# Patient Record
Sex: Female | Born: 1945 | Race: White | Hispanic: Refuse to answer | Marital: Married | Smoking: Never smoker
Health system: Southern US, Community
[De-identification: ages and names within clinical notes are randomized; demographics above are authoritative.]

## PROBLEM LIST (undated history)

## (undated) ENCOUNTER — Ambulatory Visit (HOSPITAL_BASED_OUTPATIENT_CLINIC_OR_DEPARTMENT_OTHER): Admission: EM | Source: Home / Self Care

## (undated) DIAGNOSIS — K5909 Other constipation: Secondary | ICD-10-CM

## (undated) DIAGNOSIS — R059 Cough, unspecified: Secondary | ICD-10-CM

## (undated) DIAGNOSIS — E785 Hyperlipidemia, unspecified: Secondary | ICD-10-CM

## (undated) DIAGNOSIS — J309 Allergic rhinitis, unspecified: Secondary | ICD-10-CM

## (undated) DIAGNOSIS — K219 Gastro-esophageal reflux disease without esophagitis: Secondary | ICD-10-CM

## (undated) HISTORY — DX: Cough, unspecified: R05.9

## (undated) HISTORY — DX: Allergic rhinitis, unspecified: J30.9

## (undated) HISTORY — PX: UPPER GASTROINTESTINAL ENDOSCOPY: SHX188

## (undated) HISTORY — DX: Other constipation: K59.09

## (undated) HISTORY — DX: Hyperlipidemia, unspecified: E78.5

## (undated) HISTORY — DX: Gastro-esophageal reflux disease without esophagitis: K21.9

---

## 1983-08-06 HISTORY — PX: PARTIAL HYSTERECTOMY: SHX80

## 2000-04-11 ENCOUNTER — Other Ambulatory Visit: Admission: RE | Admit: 2000-04-11 | Discharge: 2000-04-11 | Payer: Self-pay | Admitting: Obstetrics and Gynecology

## 2001-06-30 ENCOUNTER — Other Ambulatory Visit: Admission: RE | Admit: 2001-06-30 | Discharge: 2001-06-30 | Payer: Self-pay | Admitting: Obstetrics and Gynecology

## 2002-07-22 ENCOUNTER — Other Ambulatory Visit: Admission: RE | Admit: 2002-07-22 | Discharge: 2002-07-22 | Payer: Self-pay | Admitting: Obstetrics and Gynecology

## 2003-08-02 ENCOUNTER — Other Ambulatory Visit: Admission: RE | Admit: 2003-08-02 | Discharge: 2003-08-02 | Payer: Self-pay | Admitting: Obstetrics and Gynecology

## 2003-08-06 HISTORY — PX: COLONOSCOPY: SHX174

## 2004-11-01 ENCOUNTER — Other Ambulatory Visit: Admission: RE | Admit: 2004-11-01 | Discharge: 2004-11-01 | Payer: Self-pay | Admitting: Obstetrics and Gynecology

## 2012-08-05 HISTORY — PX: KNEE ARTHROSCOPY: SHX127

## 2014-09-01 DIAGNOSIS — Z6825 Body mass index (BMI) 25.0-25.9, adult: Secondary | ICD-10-CM | POA: Diagnosis not present

## 2014-09-01 DIAGNOSIS — J019 Acute sinusitis, unspecified: Secondary | ICD-10-CM | POA: Diagnosis not present

## 2014-09-01 DIAGNOSIS — J209 Acute bronchitis, unspecified: Secondary | ICD-10-CM | POA: Diagnosis not present

## 2014-09-14 DIAGNOSIS — Z6824 Body mass index (BMI) 24.0-24.9, adult: Secondary | ICD-10-CM | POA: Diagnosis not present

## 2014-09-14 DIAGNOSIS — J208 Acute bronchitis due to other specified organisms: Secondary | ICD-10-CM | POA: Diagnosis not present

## 2014-12-27 DIAGNOSIS — Z9181 History of falling: Secondary | ICD-10-CM | POA: Diagnosis not present

## 2014-12-27 DIAGNOSIS — E785 Hyperlipidemia, unspecified: Secondary | ICD-10-CM | POA: Diagnosis not present

## 2014-12-27 DIAGNOSIS — J449 Chronic obstructive pulmonary disease, unspecified: Secondary | ICD-10-CM | POA: Diagnosis not present

## 2014-12-27 DIAGNOSIS — R0789 Other chest pain: Secondary | ICD-10-CM | POA: Diagnosis not present

## 2014-12-27 DIAGNOSIS — H539 Unspecified visual disturbance: Secondary | ICD-10-CM | POA: Diagnosis not present

## 2014-12-27 DIAGNOSIS — Z0000 Encounter for general adult medical examination without abnormal findings: Secondary | ICD-10-CM | POA: Diagnosis not present

## 2014-12-27 DIAGNOSIS — R05 Cough: Secondary | ICD-10-CM | POA: Diagnosis not present

## 2014-12-27 DIAGNOSIS — Z23 Encounter for immunization: Secondary | ICD-10-CM | POA: Diagnosis not present

## 2014-12-27 DIAGNOSIS — Z1231 Encounter for screening mammogram for malignant neoplasm of breast: Secondary | ICD-10-CM | POA: Diagnosis not present

## 2015-01-16 DIAGNOSIS — H5210 Myopia, unspecified eye: Secondary | ICD-10-CM | POA: Diagnosis not present

## 2015-01-16 DIAGNOSIS — H524 Presbyopia: Secondary | ICD-10-CM | POA: Diagnosis not present

## 2015-01-25 DIAGNOSIS — Z0100 Encounter for examination of eyes and vision without abnormal findings: Secondary | ICD-10-CM | POA: Diagnosis not present

## 2015-02-01 DIAGNOSIS — Z1231 Encounter for screening mammogram for malignant neoplasm of breast: Secondary | ICD-10-CM | POA: Diagnosis not present

## 2015-05-09 DIAGNOSIS — Z6826 Body mass index (BMI) 26.0-26.9, adult: Secondary | ICD-10-CM | POA: Diagnosis not present

## 2015-05-09 DIAGNOSIS — R1032 Left lower quadrant pain: Secondary | ICD-10-CM | POA: Diagnosis not present

## 2015-06-09 DIAGNOSIS — R062 Wheezing: Secondary | ICD-10-CM | POA: Diagnosis not present

## 2015-06-09 DIAGNOSIS — J189 Pneumonia, unspecified organism: Secondary | ICD-10-CM | POA: Diagnosis not present

## 2015-07-03 DIAGNOSIS — R7309 Other abnormal glucose: Secondary | ICD-10-CM | POA: Diagnosis not present

## 2015-07-03 DIAGNOSIS — E785 Hyperlipidemia, unspecified: Secondary | ICD-10-CM | POA: Diagnosis not present

## 2015-07-11 DIAGNOSIS — R05 Cough: Secondary | ICD-10-CM | POA: Diagnosis not present

## 2015-07-11 DIAGNOSIS — R062 Wheezing: Secondary | ICD-10-CM | POA: Diagnosis not present

## 2015-08-17 DIAGNOSIS — K219 Gastro-esophageal reflux disease without esophagitis: Secondary | ICD-10-CM | POA: Diagnosis not present

## 2015-08-17 DIAGNOSIS — Z6826 Body mass index (BMI) 26.0-26.9, adult: Secondary | ICD-10-CM | POA: Diagnosis not present

## 2015-08-17 DIAGNOSIS — R05 Cough: Secondary | ICD-10-CM | POA: Diagnosis not present

## 2015-09-06 DIAGNOSIS — H60539 Acute contact otitis externa, unspecified ear: Secondary | ICD-10-CM | POA: Diagnosis not present

## 2015-09-06 DIAGNOSIS — J0100 Acute maxillary sinusitis, unspecified: Secondary | ICD-10-CM | POA: Diagnosis not present

## 2015-10-17 DIAGNOSIS — R05 Cough: Secondary | ICD-10-CM | POA: Diagnosis not present

## 2015-10-17 DIAGNOSIS — K219 Gastro-esophageal reflux disease without esophagitis: Secondary | ICD-10-CM | POA: Diagnosis not present

## 2015-11-01 DIAGNOSIS — K2900 Acute gastritis without bleeding: Secondary | ICD-10-CM | POA: Diagnosis not present

## 2015-11-01 DIAGNOSIS — R05 Cough: Secondary | ICD-10-CM | POA: Diagnosis not present

## 2015-11-01 DIAGNOSIS — K2970 Gastritis, unspecified, without bleeding: Secondary | ICD-10-CM | POA: Diagnosis not present

## 2015-11-01 DIAGNOSIS — M1990 Unspecified osteoarthritis, unspecified site: Secondary | ICD-10-CM | POA: Diagnosis not present

## 2015-11-01 DIAGNOSIS — E785 Hyperlipidemia, unspecified: Secondary | ICD-10-CM | POA: Diagnosis not present

## 2015-11-01 DIAGNOSIS — K449 Diaphragmatic hernia without obstruction or gangrene: Secondary | ICD-10-CM | POA: Diagnosis not present

## 2015-11-01 DIAGNOSIS — Z79899 Other long term (current) drug therapy: Secondary | ICD-10-CM | POA: Diagnosis not present

## 2015-11-01 DIAGNOSIS — K219 Gastro-esophageal reflux disease without esophagitis: Secondary | ICD-10-CM | POA: Diagnosis not present

## 2015-11-01 DIAGNOSIS — K209 Esophagitis, unspecified: Secondary | ICD-10-CM | POA: Diagnosis not present

## 2015-12-04 DIAGNOSIS — Z6825 Body mass index (BMI) 25.0-25.9, adult: Secondary | ICD-10-CM | POA: Diagnosis not present

## 2015-12-04 DIAGNOSIS — M79672 Pain in left foot: Secondary | ICD-10-CM | POA: Diagnosis not present

## 2015-12-12 DIAGNOSIS — R062 Wheezing: Secondary | ICD-10-CM | POA: Diagnosis not present

## 2015-12-12 DIAGNOSIS — J209 Acute bronchitis, unspecified: Secondary | ICD-10-CM | POA: Diagnosis not present

## 2015-12-20 ENCOUNTER — Ambulatory Visit: Payer: Self-pay | Admitting: Sports Medicine

## 2015-12-22 DIAGNOSIS — J209 Acute bronchitis, unspecified: Secondary | ICD-10-CM | POA: Diagnosis not present

## 2015-12-29 DIAGNOSIS — E785 Hyperlipidemia, unspecified: Secondary | ICD-10-CM | POA: Diagnosis not present

## 2015-12-29 DIAGNOSIS — Z0000 Encounter for general adult medical examination without abnormal findings: Secondary | ICD-10-CM | POA: Diagnosis not present

## 2015-12-29 DIAGNOSIS — R05 Cough: Secondary | ICD-10-CM | POA: Diagnosis not present

## 2015-12-29 DIAGNOSIS — M8589 Other specified disorders of bone density and structure, multiple sites: Secondary | ICD-10-CM | POA: Diagnosis not present

## 2015-12-29 DIAGNOSIS — N939 Abnormal uterine and vaginal bleeding, unspecified: Secondary | ICD-10-CM | POA: Diagnosis not present

## 2015-12-29 DIAGNOSIS — Z6825 Body mass index (BMI) 25.0-25.9, adult: Secondary | ICD-10-CM | POA: Diagnosis not present

## 2015-12-29 DIAGNOSIS — R7309 Other abnormal glucose: Secondary | ICD-10-CM | POA: Diagnosis not present

## 2015-12-29 DIAGNOSIS — E663 Overweight: Secondary | ICD-10-CM | POA: Diagnosis not present

## 2015-12-29 DIAGNOSIS — Z1231 Encounter for screening mammogram for malignant neoplasm of breast: Secondary | ICD-10-CM | POA: Diagnosis not present

## 2016-01-03 DIAGNOSIS — R05 Cough: Secondary | ICD-10-CM | POA: Diagnosis not present

## 2016-01-03 DIAGNOSIS — R918 Other nonspecific abnormal finding of lung field: Secondary | ICD-10-CM | POA: Diagnosis not present

## 2016-01-04 ENCOUNTER — Ambulatory Visit: Payer: Self-pay | Admitting: Sports Medicine

## 2016-01-29 DIAGNOSIS — R938 Abnormal findings on diagnostic imaging of other specified body structures: Secondary | ICD-10-CM | POA: Diagnosis not present

## 2016-01-29 DIAGNOSIS — E785 Hyperlipidemia, unspecified: Secondary | ICD-10-CM | POA: Diagnosis not present

## 2016-01-29 DIAGNOSIS — I2510 Atherosclerotic heart disease of native coronary artery without angina pectoris: Secondary | ICD-10-CM | POA: Diagnosis not present

## 2016-01-29 DIAGNOSIS — R0609 Other forms of dyspnea: Secondary | ICD-10-CM | POA: Diagnosis not present

## 2016-02-08 DIAGNOSIS — R0609 Other forms of dyspnea: Secondary | ICD-10-CM | POA: Diagnosis not present

## 2016-02-08 DIAGNOSIS — R938 Abnormal findings on diagnostic imaging of other specified body structures: Secondary | ICD-10-CM | POA: Diagnosis not present

## 2016-02-08 DIAGNOSIS — E785 Hyperlipidemia, unspecified: Secondary | ICD-10-CM | POA: Diagnosis not present

## 2016-02-08 DIAGNOSIS — I2510 Atherosclerotic heart disease of native coronary artery without angina pectoris: Secondary | ICD-10-CM | POA: Diagnosis not present

## 2016-02-15 ENCOUNTER — Encounter: Payer: Self-pay | Admitting: Pulmonary Disease

## 2016-02-16 ENCOUNTER — Institutional Professional Consult (permissible substitution): Payer: Self-pay | Admitting: Pulmonary Disease

## 2016-02-22 ENCOUNTER — Encounter: Payer: Self-pay | Admitting: Internal Medicine

## 2016-02-23 ENCOUNTER — Encounter: Payer: Self-pay | Admitting: Internal Medicine

## 2016-02-23 ENCOUNTER — Ambulatory Visit (INDEPENDENT_AMBULATORY_CARE_PROVIDER_SITE_OTHER): Payer: Commercial Managed Care - HMO | Admitting: Internal Medicine

## 2016-02-23 VITALS — BP 138/78 | HR 72 | Ht 67.0 in | Wt 164.0 lb

## 2016-02-23 DIAGNOSIS — R053 Chronic cough: Secondary | ICD-10-CM

## 2016-02-23 DIAGNOSIS — R05 Cough: Secondary | ICD-10-CM

## 2016-02-23 DIAGNOSIS — R918 Other nonspecific abnormal finding of lung field: Secondary | ICD-10-CM | POA: Diagnosis not present

## 2016-02-23 HISTORY — DX: Other nonspecific abnormal finding of lung field: R91.8

## 2016-02-23 HISTORY — DX: Chronic cough: R05.3

## 2016-02-23 LAB — NITRIC OXIDE: NITRIC OXIDE: 23

## 2016-02-23 MED ORDER — GABAPENTIN 300 MG PO CAPS: ORAL_CAPSULE | ORAL | Status: DC

## 2016-02-23 NOTE — Progress Notes (Signed)
   Subjective:    Patient ID: Gina Hansen, female    DOB: [birth year], 70 y.o.   MRN: [id number]  HPI    Review of Systems  Constitutional: Negative for fever and unexpected weight change.  HENT: Negative for congestion, dental problem, ear pain, nosebleeds, postnasal drip, rhinorrhea, sinus pressure, sneezing, sore throat and trouble swallowing.   Eyes: Negative for redness and itching.  Respiratory: Positive for cough, shortness of breath and wheezing. Negative for chest tightness.   Cardiovascular: Negative for palpitations and leg swelling.  Gastrointestinal: Negative for nausea and vomiting.  Genitourinary: Negative for dysuria.  Musculoskeletal: Negative for joint swelling.  Skin: Negative for rash.  Neurological: Negative for headaches.  Hematological: Does not bruise/bleed easily.  Psychiatric/Behavioral: Negative for dysphoric mood. The patient is not nervous/anxious.        Objective:   Physical Exam        Assessment & Plan:

## 2016-02-23 NOTE — Patient Instructions (Addendum)
ICD-9-CM ICD-10-CM   1. Chronic cough 786.2 R05   2. Pulmonary infiltrate present on computed tomography 793.19 R91.8     Cough is from possible sinus drainage, possible  acid reflux, and talking a lot No evidence of asthma at this poiint Unclear how much the infiltrate is playing a role in cough All of this is working together to cause cyclical cough/LPR cough or cough neuropathy  #Sinus drainage  - - continue anti-histamine - - continuet nasal steroid  - CT sinus wo contrast in 6 weeks - Depending on response can consider ENT consult  #Possible Acid Reflux  - stop fish oil  - take otc zegerid 20mg   1 capsule daily on empty stomach    - - At all times avoid colas, spices, cheeses, spirits, red meats, beer, chocolates, fried foods etc.,   - sleep with head end of bed elevated  - eat small frequent meals  - do not go to bed for 3 hours after last meal    #Cyclical cough/Irritable Larynx  - please choose 2-3 days and observe complete voice rest - no talking or whispering  - at all times there  there is urge to cough, drink water or swallow or sip on throat lozenge - see Mr Garald Balding speech therapist - Take gabapentin 300mg  once daily x 5 days, then 300mg  twice daily x 5 days, then 300mg  three times daily to continue. If this makes you too sleepy or drowsy call us and we will cut your medication dosing down  #pulmonary infiltrate   - do repeat HRCT chest wo contrast early sept 2017 along with sinus CT  #Followup - CT sinus and HRCT chest early sept 2017 at [facility] - I will see you in early sep 2017 after the CTs and as afollowup

## 2016-02-23 NOTE — Addendum Note (Signed)
Addended by: Inge Rise on: 02/23/2016 09:57 AM   Modules accepted: Orders

## 2016-02-23 NOTE — Addendum Note (Signed)
Addended by: Inge Rise on: 02/23/2016 09:54 AM   Modules accepted: Orders

## 2016-02-23 NOTE — Progress Notes (Signed)
Subjective:     Patient ID: Gina Hansen, female   DOB: [birth year], 70 y.o.   MRN: [id number]  PCP Nicoletta Dress, MD   HPI   PCP Nicoletta Dress, MD   IOV 02/23/2016  Chief Complaint  Patient presents with  . Advice Only    Refer for abnorm CT done at [facility]. C/o some SOB, wheezing a lot, prod cough (yellow).    70 year old female referred for chronic cough. History is given by her and review of the outside chart. Insidious onset of cough for the last several years. Worse in the last year. She believes that ever since she started taking care of grandkids and having to talk a lot cough is worse. The quality of the cough is mostly dry but occasionally she has yellow sputum and associated wheezing especially this past year. There is no nocturnal awakening associated with the cough. Cough is made worse by talking. Cough is relieved by throat lozenge and staying quite. There is an associated feeling of itchiness in and constant sensation of something like according in her throat. RSI cough score is 12.  Cough associated history  Sinus drainage: She has spring allergies. She is on chronic antihistamine for many years. The last week she is on nasal steroids she does not know if this is helping her. She has never seen ENT.Marland Kitchen  Acid reflux: She believes she has mild acid reflux. She occasionally takes ranitidine. Earlier in 2017 did have upper endoscopy by Dr. Lyndel Safe gastroenterologist at [facility]. This was apparently reassuring. She is on daily fish oil for many years  Pulmonary history: She never has had a formal diagnosis of pulmonary disease. She says that over the many years she's had recurrent episodes of wheezing and cough that she's had multiple chest x-rays which are all clear. And also due to chronic cough 01/03/2016 and because of a clear chest x-ray pulmonary CT chest was done. This shows possible bronchiectasis in the lingula associated with some pulmonary infiltrates.  She is surprised by this finding. She never had any fever or chills at any point in time. Exhaled nitric oxide today was normal. She has tried Symbicort in the past and multiple prednisone courses these have not help her cough.  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 Details of mold exposure at that are not elicited.      Dr Lorenza Cambridge Reflux Symptom Index (> 13-15 suggestive of LPR cough) 0 -> 5  =  none ->severe problem 02/23/2016   Hoarseness of problem with voice 1  Clearing  Of Throat 2  Excess throat mucus or feeling of post nasal drip 2  Difficulty swallowing food, liquid or tablets 0  Cough after eating or lying down 1  Breathing difficulties or choking episodes 1  Troublesome or annoying cough 2  Sensation of something sticking in throat or lump in throat 2  Heartburn, chest pain, indigestion, or stomach acid coming up 1  TOTAL 12      Imaging evaluation 01/03/2015 she had CT chest without contrast at [facility]. Personally visualized image. She does have what appears to be mild bronchiectasis along with patchy infiltrate surrounding that in the lingula in the anterior component right behind the anterior chest wall.  Exhaled nitric oxide today in our office 02/23/2016: 23 ppb and normal   has a past medical history of Dyslipidemia; GERD (gastroesophageal reflux disease); and Allergic rhinitis.  reports that she has never smoked. She does not have any smokeless tobacco history on file.  Past Surgical History  Procedure Laterality Date  . Colonoscopy  2005  . Partial hysterectomy Left 1985  . Knee arthroscopy Left 2014    Allergies  Allergen Reactions  . Levaquin [Levofloxacin In D5w]     Immunization History  Administered Date(s) Administered  . Influenza Split 05/06/2015  . Pneumococcal Conjugate-13 08/05/2013  . Zoster 07/05/2014    Family History  Problem Relation Age  of Onset  . Kidney disease Father   . Anemia Other   . Hypertension Mother   . Heart disease Father   . Asthma Father   . Hypercholesterolemia Other      Current outpatient prescriptions:  .  aspirin 81 MG tablet, Take 81 mg by mouth daily., Disp: , Rfl:  .  Calcium Carb-Cholecalciferol (CALCIUM 600 + D PO), Take 1 tablet by mouth 2 (two) times daily., Disp: , Rfl:  .  cetirizine (ZYRTEC) 10 MG tablet, Take 10 mg by mouth daily., Disp: , Rfl:  .  Coenzyme Q10 (COQ10) 200 MG CAPS, Take 1 capsule by mouth daily., Disp: , Rfl:  .  Omega-3 Fatty Acids (FISH OIL) 1200 MG CAPS, Take 1 capsule by mouth 2 (two) times daily., Disp: , Rfl:  .  ranitidine (ZANTAC) 150 MG tablet, Take 150 mg by mouth 2 (two) times daily., Disp: , Rfl:  .  rosuvastatin (CRESTOR) 5 MG tablet, Take 5 mg by mouth daily., Disp: , Rfl:      Review of Systems     Objective:   Physical Exam  Constitutional: She is oriented to person, place, and time. She appears well-developed and well-nourished. No distress.  HENT:  Head: Normocephalic and atraumatic.  Right Ear: External ear normal.  Left Ear: External ear normal.  Mouth/Throat: Oropharynx is clear and moist. No oropharyngeal exudate.  Mild postnasal drip Clears her throat periodically  Eyes: Conjunctivae and EOM are normal. Pupils are equal, round, and reactive to light. Right eye exhibits no discharge. Left eye exhibits no discharge. No scleral icterus.  Neck: Normal range of motion. Neck supple. No JVD present. No tracheal deviation present. No thyromegaly present.  Cardiovascular: Normal rate, regular rhythm, normal heart sounds and intact distal pulses.  Exam reveals no gallop and no friction rub.   No murmur heard. Pulmonary/Chest: Effort normal and breath sounds normal. No respiratory distress. She has no wheezes. She has no rales. She exhibits no tenderness.  Abdominal: Soft. Bowel sounds are normal. She exhibits no distension and no mass. There is no  tenderness. There is no rebound and no guarding.  Musculoskeletal: Normal range of motion. She exhibits no edema or tenderness.  Lymphadenopathy:    She has no cervical adenopathy.  Neurological: She is alert and oriented to person, place, and time. She has normal reflexes. No cranial nerve deficit. She exhibits normal muscle tone. Coordination normal.  Skin: Skin is warm and dry. No rash noted. She is not diaphoretic. No erythema. No pallor.  Psychiatric: She has a normal mood and affect. Her behavior is normal. Judgment and thought content normal.  Vitals reviewed.   Filed Vitals:   02/23/16 0911  BP: 138/78  Pulse: 72  Height: 5\' 7"  (1.702 m)  Weight: 164 lb (74.39 kg)  SpO2: 99%        Assessment:       ICD-9-CM ICD-10-CM   1. Chronic cough 786.2 R05   2. Pulmonary infiltrate present  on computed tomography 793.19 R91.8        Plan:      Cough is from possible sinus drainage, possible  acid reflux, and talking a lot No evidence of asthma at this poiint Unclear how much the infiltrate is playing a role in cough All of this is working together to cause cyclical cough/LPR cough or cough neuropathy  #Sinus drainage  - - continue anti-histamine - - continuet nasal steroid  - CT sinus wo contrast in 6 weeks - ENT consult depending on follow-up progress  #Possible Acid Reflux - Stop fish oil  - take otc zegerid 20mg   1 capsule daily on empty stomach    - - At all times avoid colas, spices, cheeses, spirits, red meats, beer, chocolates, fried foods etc.,   - sleep with head end of bed elevated  - eat small frequent meals  - do not go to bed for 3 hours after last meal    #Cyclical cough/Irritable Larynx  - please choose 2-3 days and observe complete voice rest - no talking or whispering  - at all times there  there is urge to cough, drink water or swallow or sip on throat lozenge - see Mr Garald Balding speech therapist - Take gabapentin 300mg  once daily x 5 days,  then 300mg  twice daily x 5 days, then 300mg  three times daily to continue. If this makes you too sleepy or drowsy call us and we will cut your medication dosing down  #pulmonary infiltrate   - do repeat HRCT chest wo contrast early sept 2017 along with sinus CT  #Followup - CT sinus and HRCT chest early sept 2017 at [facility] - I will see you in early sep 2017 after the CTs and as afollowup  Dr. Brand Males, M.D., [facility].C.P Pulmonary and Critical Care Medicine Staff Physician [place] [facility] Critical [facility] Pager: [phone], If no answer or between  15:00h - 7:00h: call [phone]  02/23/2016 9:46 AM

## 2016-03-06 DIAGNOSIS — Z1231 Encounter for screening mammogram for malignant neoplasm of breast: Secondary | ICD-10-CM | POA: Diagnosis not present

## 2016-03-06 DIAGNOSIS — R0609 Other forms of dyspnea: Secondary | ICD-10-CM | POA: Diagnosis not present

## 2016-03-06 DIAGNOSIS — E785 Hyperlipidemia, unspecified: Secondary | ICD-10-CM | POA: Diagnosis not present

## 2016-03-06 DIAGNOSIS — I2510 Atherosclerotic heart disease of native coronary artery without angina pectoris: Secondary | ICD-10-CM | POA: Diagnosis not present

## 2016-03-11 DIAGNOSIS — R112 Nausea with vomiting, unspecified: Secondary | ICD-10-CM | POA: Diagnosis not present

## 2016-03-11 DIAGNOSIS — R1013 Epigastric pain: Secondary | ICD-10-CM | POA: Diagnosis not present

## 2016-03-11 DIAGNOSIS — K219 Gastro-esophageal reflux disease without esophagitis: Secondary | ICD-10-CM | POA: Diagnosis not present

## 2016-03-27 DIAGNOSIS — R0609 Other forms of dyspnea: Secondary | ICD-10-CM | POA: Diagnosis not present

## 2016-03-27 DIAGNOSIS — E785 Hyperlipidemia, unspecified: Secondary | ICD-10-CM | POA: Diagnosis not present

## 2016-03-27 DIAGNOSIS — I2510 Atherosclerotic heart disease of native coronary artery without angina pectoris: Secondary | ICD-10-CM | POA: Diagnosis not present

## 2016-04-10 DIAGNOSIS — R918 Other nonspecific abnormal finding of lung field: Secondary | ICD-10-CM | POA: Diagnosis not present

## 2016-04-10 DIAGNOSIS — I700 Atherosclerosis of aorta: Secondary | ICD-10-CM | POA: Diagnosis not present

## 2016-04-10 DIAGNOSIS — R05 Cough: Secondary | ICD-10-CM | POA: Diagnosis not present

## 2016-04-16 ENCOUNTER — Ambulatory Visit: Payer: Commercial Managed Care - HMO | Admitting: Internal Medicine

## 2016-04-22 ENCOUNTER — Telehealth: Payer: Self-pay | Admitting: Internal Medicine

## 2016-04-22 DIAGNOSIS — J320 Chronic maxillary sinusitis: Secondary | ICD-10-CM

## 2016-04-22 NOTE — Telephone Encounter (Signed)
CT 04/10/16 at [place] of sinuses - shows Chronic left maxillary sinus disease. Please refer her to ENT. Give fu in 1 month for chronic cough  Dr. Brand Males, M.D., [facility].C.P Pulmonary and Critical Care Medicine Staff Physician [place] Pulmonary and Critical Care Pager: [phone], If no answer or between  15:00h - 7:00h: call [phone]  04/22/2016 3:45 PM

## 2016-04-23 NOTE — Telephone Encounter (Signed)
Patient returning call - she can be reached on [phone]

## 2016-04-23 NOTE — Telephone Encounter (Signed)
Spoke with Gina Hansen. She is aware of results. Gina Hansen would like to referred to Dr. Melina Modena in [place]. Referral has been placed.

## 2016-04-23 NOTE — Telephone Encounter (Signed)
lmtcb for pt.  

## 2016-05-09 DIAGNOSIS — R05 Cough: Secondary | ICD-10-CM | POA: Diagnosis not present

## 2016-05-09 DIAGNOSIS — K219 Gastro-esophageal reflux disease without esophagitis: Secondary | ICD-10-CM | POA: Diagnosis not present

## 2016-05-09 DIAGNOSIS — Z9109 Other allergy status, other than to drugs and biological substances: Secondary | ICD-10-CM | POA: Diagnosis not present

## 2016-05-23 DIAGNOSIS — M25512 Pain in left shoulder: Secondary | ICD-10-CM | POA: Diagnosis not present

## 2016-05-23 DIAGNOSIS — S4992XA Unspecified injury of left shoulder and upper arm, initial encounter: Secondary | ICD-10-CM | POA: Diagnosis not present

## 2016-05-23 DIAGNOSIS — S8001XA Contusion of right knee, initial encounter: Secondary | ICD-10-CM | POA: Diagnosis not present

## 2016-05-23 DIAGNOSIS — S52132A Displaced fracture of neck of left radius, initial encounter for closed fracture: Secondary | ICD-10-CM | POA: Diagnosis not present

## 2016-05-23 DIAGNOSIS — S6992XA Unspecified injury of left wrist, hand and finger(s), initial encounter: Secondary | ICD-10-CM | POA: Diagnosis not present

## 2016-05-23 DIAGNOSIS — M25532 Pain in left wrist: Secondary | ICD-10-CM | POA: Diagnosis not present

## 2016-05-23 DIAGNOSIS — M7989 Other specified soft tissue disorders: Secondary | ICD-10-CM | POA: Diagnosis not present

## 2016-05-23 DIAGNOSIS — Z23 Encounter for immunization: Secondary | ICD-10-CM | POA: Diagnosis not present

## 2016-05-23 DIAGNOSIS — S52002A Unspecified fracture of upper end of left ulna, initial encounter for closed fracture: Secondary | ICD-10-CM | POA: Diagnosis not present

## 2016-05-23 DIAGNOSIS — S53105A Unspecified dislocation of left ulnohumeral joint, initial encounter: Secondary | ICD-10-CM | POA: Diagnosis not present

## 2016-05-23 DIAGNOSIS — S52102A Unspecified fracture of upper end of left radius, initial encounter for closed fracture: Secondary | ICD-10-CM | POA: Diagnosis not present

## 2016-05-23 DIAGNOSIS — S52042A Displaced fracture of coronoid process of left ulna, initial encounter for closed fracture: Secondary | ICD-10-CM | POA: Diagnosis not present

## 2016-05-24 DIAGNOSIS — S42402A Unspecified fracture of lower end of left humerus, initial encounter for closed fracture: Secondary | ICD-10-CM | POA: Diagnosis not present

## 2016-05-24 DIAGNOSIS — S52272A Monteggia's fracture of left ulna, initial encounter for closed fracture: Secondary | ICD-10-CM | POA: Diagnosis not present

## 2016-05-24 DIAGNOSIS — M7989 Other specified soft tissue disorders: Secondary | ICD-10-CM | POA: Diagnosis not present

## 2016-05-24 DIAGNOSIS — S52002A Unspecified fracture of upper end of left ulna, initial encounter for closed fracture: Secondary | ICD-10-CM | POA: Diagnosis not present

## 2016-05-28 DIAGNOSIS — S52272A Monteggia's fracture of left ulna, initial encounter for closed fracture: Secondary | ICD-10-CM | POA: Diagnosis not present

## 2016-05-28 DIAGNOSIS — S52092A Other fracture of upper end of left ulna, initial encounter for closed fracture: Secondary | ICD-10-CM | POA: Diagnosis not present

## 2016-05-28 DIAGNOSIS — S42402A Unspecified fracture of lower end of left humerus, initial encounter for closed fracture: Secondary | ICD-10-CM | POA: Diagnosis not present

## 2016-05-28 DIAGNOSIS — S52122A Displaced fracture of head of left radius, initial encounter for closed fracture: Secondary | ICD-10-CM | POA: Diagnosis not present

## 2016-06-03 DIAGNOSIS — S52032A Displaced fracture of olecranon process with intraarticular extension of left ulna, initial encounter for closed fracture: Secondary | ICD-10-CM | POA: Diagnosis not present

## 2016-06-03 DIAGNOSIS — S53105A Unspecified dislocation of left ulnohumeral joint, initial encounter: Secondary | ICD-10-CM | POA: Diagnosis not present

## 2016-06-03 DIAGNOSIS — S52132A Displaced fracture of neck of left radius, initial encounter for closed fracture: Secondary | ICD-10-CM | POA: Diagnosis not present

## 2016-06-03 DIAGNOSIS — J45909 Unspecified asthma, uncomplicated: Secondary | ICD-10-CM | POA: Diagnosis not present

## 2016-06-03 DIAGNOSIS — Z881 Allergy status to other antibiotic agents status: Secondary | ICD-10-CM | POA: Diagnosis not present

## 2016-06-03 DIAGNOSIS — K219 Gastro-esophageal reflux disease without esophagitis: Secondary | ICD-10-CM | POA: Diagnosis not present

## 2016-06-03 DIAGNOSIS — R05 Cough: Secondary | ICD-10-CM | POA: Diagnosis not present

## 2016-06-03 DIAGNOSIS — S52022A Displaced fracture of olecranon process without intraarticular extension of left ulna, initial encounter for closed fracture: Secondary | ICD-10-CM | POA: Diagnosis not present

## 2016-06-03 DIAGNOSIS — S52272A Monteggia's fracture of left ulna, initial encounter for closed fracture: Secondary | ICD-10-CM | POA: Diagnosis not present

## 2016-06-03 DIAGNOSIS — E785 Hyperlipidemia, unspecified: Secondary | ICD-10-CM | POA: Diagnosis not present

## 2016-06-03 DIAGNOSIS — S52042A Displaced fracture of coronoid process of left ulna, initial encounter for closed fracture: Secondary | ICD-10-CM | POA: Diagnosis not present

## 2016-06-03 DIAGNOSIS — S52022D Displaced fracture of olecranon process without intraarticular extension of left ulna, subsequent encounter for closed fracture with routine healing: Secondary | ICD-10-CM | POA: Diagnosis not present

## 2016-06-03 DIAGNOSIS — S53005A Unspecified dislocation of left radial head, initial encounter: Secondary | ICD-10-CM | POA: Diagnosis not present

## 2016-06-19 DIAGNOSIS — S52272D Monteggia's fracture of left ulna, subsequent encounter for closed fracture with routine healing: Secondary | ICD-10-CM | POA: Diagnosis not present

## 2016-06-19 DIAGNOSIS — Z881 Allergy status to other antibiotic agents status: Secondary | ICD-10-CM | POA: Diagnosis not present

## 2016-06-19 DIAGNOSIS — S42402D Unspecified fracture of lower end of left humerus, subsequent encounter for fracture with routine healing: Secondary | ICD-10-CM | POA: Diagnosis not present

## 2016-06-19 DIAGNOSIS — M25422 Effusion, left elbow: Secondary | ICD-10-CM | POA: Diagnosis not present

## 2016-07-02 DIAGNOSIS — S52272D Monteggia's fracture of left ulna, subsequent encounter for closed fracture with routine healing: Secondary | ICD-10-CM | POA: Diagnosis not present

## 2016-07-02 DIAGNOSIS — S52122D Displaced fracture of head of left radius, subsequent encounter for closed fracture with routine healing: Secondary | ICD-10-CM | POA: Diagnosis not present

## 2016-07-02 DIAGNOSIS — S52002D Unspecified fracture of upper end of left ulna, subsequent encounter for closed fracture with routine healing: Secondary | ICD-10-CM | POA: Diagnosis not present

## 2016-07-02 DIAGNOSIS — Z471 Aftercare following joint replacement surgery: Secondary | ICD-10-CM | POA: Diagnosis not present

## 2016-07-02 DIAGNOSIS — S42402D Unspecified fracture of lower end of left humerus, subsequent encounter for fracture with routine healing: Secondary | ICD-10-CM | POA: Diagnosis not present

## 2016-07-02 DIAGNOSIS — Z96622 Presence of left artificial elbow joint: Secondary | ICD-10-CM | POA: Diagnosis not present

## 2016-07-08 DIAGNOSIS — Z23 Encounter for immunization: Secondary | ICD-10-CM | POA: Diagnosis not present

## 2016-07-08 DIAGNOSIS — Z1389 Encounter for screening for other disorder: Secondary | ICD-10-CM | POA: Diagnosis not present

## 2016-07-08 DIAGNOSIS — K219 Gastro-esophageal reflux disease without esophagitis: Secondary | ICD-10-CM | POA: Diagnosis not present

## 2016-07-08 DIAGNOSIS — R7309 Other abnormal glucose: Secondary | ICD-10-CM | POA: Diagnosis not present

## 2016-07-08 DIAGNOSIS — E785 Hyperlipidemia, unspecified: Secondary | ICD-10-CM | POA: Diagnosis not present

## 2016-07-08 DIAGNOSIS — Z9181 History of falling: Secondary | ICD-10-CM | POA: Diagnosis not present

## 2016-07-08 DIAGNOSIS — M8589 Other specified disorders of bone density and structure, multiple sites: Secondary | ICD-10-CM | POA: Diagnosis not present

## 2016-07-08 DIAGNOSIS — E663 Overweight: Secondary | ICD-10-CM | POA: Diagnosis not present

## 2016-07-12 DIAGNOSIS — S52272D Monteggia's fracture of left ulna, subsequent encounter for closed fracture with routine healing: Secondary | ICD-10-CM | POA: Diagnosis not present

## 2016-07-12 DIAGNOSIS — M25622 Stiffness of left elbow, not elsewhere classified: Secondary | ICD-10-CM | POA: Diagnosis not present

## 2016-07-12 DIAGNOSIS — M25632 Stiffness of left wrist, not elsewhere classified: Secondary | ICD-10-CM | POA: Diagnosis not present

## 2016-07-12 DIAGNOSIS — M6281 Muscle weakness (generalized): Secondary | ICD-10-CM | POA: Diagnosis not present

## 2016-07-15 DIAGNOSIS — S52272D Monteggia's fracture of left ulna, subsequent encounter for closed fracture with routine healing: Secondary | ICD-10-CM | POA: Diagnosis not present

## 2016-07-15 DIAGNOSIS — M25622 Stiffness of left elbow, not elsewhere classified: Secondary | ICD-10-CM | POA: Diagnosis not present

## 2016-07-15 DIAGNOSIS — M6281 Muscle weakness (generalized): Secondary | ICD-10-CM | POA: Diagnosis not present

## 2016-07-15 DIAGNOSIS — M25632 Stiffness of left wrist, not elsewhere classified: Secondary | ICD-10-CM | POA: Diagnosis not present

## 2016-07-19 DIAGNOSIS — S52272D Monteggia's fracture of left ulna, subsequent encounter for closed fracture with routine healing: Secondary | ICD-10-CM | POA: Diagnosis not present

## 2016-07-19 DIAGNOSIS — M25632 Stiffness of left wrist, not elsewhere classified: Secondary | ICD-10-CM | POA: Diagnosis not present

## 2016-07-19 DIAGNOSIS — M25622 Stiffness of left elbow, not elsewhere classified: Secondary | ICD-10-CM | POA: Diagnosis not present

## 2016-07-19 DIAGNOSIS — M6281 Muscle weakness (generalized): Secondary | ICD-10-CM | POA: Diagnosis not present

## 2016-07-22 DIAGNOSIS — M25632 Stiffness of left wrist, not elsewhere classified: Secondary | ICD-10-CM | POA: Diagnosis not present

## 2016-07-22 DIAGNOSIS — M25622 Stiffness of left elbow, not elsewhere classified: Secondary | ICD-10-CM | POA: Diagnosis not present

## 2016-07-22 DIAGNOSIS — S52272D Monteggia's fracture of left ulna, subsequent encounter for closed fracture with routine healing: Secondary | ICD-10-CM | POA: Diagnosis not present

## 2016-07-22 DIAGNOSIS — M6281 Muscle weakness (generalized): Secondary | ICD-10-CM | POA: Diagnosis not present

## 2016-07-24 DIAGNOSIS — M6281 Muscle weakness (generalized): Secondary | ICD-10-CM | POA: Diagnosis not present

## 2016-07-24 DIAGNOSIS — M25632 Stiffness of left wrist, not elsewhere classified: Secondary | ICD-10-CM | POA: Diagnosis not present

## 2016-07-24 DIAGNOSIS — S52272D Monteggia's fracture of left ulna, subsequent encounter for closed fracture with routine healing: Secondary | ICD-10-CM | POA: Diagnosis not present

## 2016-07-24 DIAGNOSIS — M25622 Stiffness of left elbow, not elsewhere classified: Secondary | ICD-10-CM | POA: Diagnosis not present

## 2016-07-31 DIAGNOSIS — M25632 Stiffness of left wrist, not elsewhere classified: Secondary | ICD-10-CM | POA: Diagnosis not present

## 2016-07-31 DIAGNOSIS — M25622 Stiffness of left elbow, not elsewhere classified: Secondary | ICD-10-CM | POA: Diagnosis not present

## 2016-07-31 DIAGNOSIS — S52272D Monteggia's fracture of left ulna, subsequent encounter for closed fracture with routine healing: Secondary | ICD-10-CM | POA: Diagnosis not present

## 2016-07-31 DIAGNOSIS — M6281 Muscle weakness (generalized): Secondary | ICD-10-CM | POA: Diagnosis not present

## 2016-08-06 DIAGNOSIS — S52272D Monteggia's fracture of left ulna, subsequent encounter for closed fracture with routine healing: Secondary | ICD-10-CM | POA: Diagnosis not present

## 2016-08-06 DIAGNOSIS — M6281 Muscle weakness (generalized): Secondary | ICD-10-CM | POA: Diagnosis not present

## 2016-08-06 DIAGNOSIS — M25632 Stiffness of left wrist, not elsewhere classified: Secondary | ICD-10-CM | POA: Diagnosis not present

## 2016-08-06 DIAGNOSIS — M25622 Stiffness of left elbow, not elsewhere classified: Secondary | ICD-10-CM | POA: Diagnosis not present

## 2016-08-12 DIAGNOSIS — M8589 Other specified disorders of bone density and structure, multiple sites: Secondary | ICD-10-CM | POA: Diagnosis not present

## 2016-08-12 DIAGNOSIS — M8588 Other specified disorders of bone density and structure, other site: Secondary | ICD-10-CM | POA: Diagnosis not present

## 2016-08-13 DIAGNOSIS — M25622 Stiffness of left elbow, not elsewhere classified: Secondary | ICD-10-CM | POA: Diagnosis not present

## 2016-08-13 DIAGNOSIS — M25632 Stiffness of left wrist, not elsewhere classified: Secondary | ICD-10-CM | POA: Diagnosis not present

## 2016-08-13 DIAGNOSIS — S52272D Monteggia's fracture of left ulna, subsequent encounter for closed fracture with routine healing: Secondary | ICD-10-CM | POA: Diagnosis not present

## 2016-08-13 DIAGNOSIS — M6281 Muscle weakness (generalized): Secondary | ICD-10-CM | POA: Diagnosis not present

## 2016-08-15 DIAGNOSIS — M25622 Stiffness of left elbow, not elsewhere classified: Secondary | ICD-10-CM | POA: Diagnosis not present

## 2016-08-15 DIAGNOSIS — M25632 Stiffness of left wrist, not elsewhere classified: Secondary | ICD-10-CM | POA: Diagnosis not present

## 2016-08-15 DIAGNOSIS — S52272D Monteggia's fracture of left ulna, subsequent encounter for closed fracture with routine healing: Secondary | ICD-10-CM | POA: Diagnosis not present

## 2016-08-15 DIAGNOSIS — M6281 Muscle weakness (generalized): Secondary | ICD-10-CM | POA: Diagnosis not present

## 2016-08-20 DIAGNOSIS — M25632 Stiffness of left wrist, not elsewhere classified: Secondary | ICD-10-CM | POA: Diagnosis not present

## 2016-08-20 DIAGNOSIS — S52272D Monteggia's fracture of left ulna, subsequent encounter for closed fracture with routine healing: Secondary | ICD-10-CM | POA: Diagnosis not present

## 2016-08-20 DIAGNOSIS — M25622 Stiffness of left elbow, not elsewhere classified: Secondary | ICD-10-CM | POA: Diagnosis not present

## 2016-08-20 DIAGNOSIS — M6281 Muscle weakness (generalized): Secondary | ICD-10-CM | POA: Diagnosis not present

## 2016-08-26 DIAGNOSIS — S52272D Monteggia's fracture of left ulna, subsequent encounter for closed fracture with routine healing: Secondary | ICD-10-CM | POA: Diagnosis not present

## 2016-08-26 DIAGNOSIS — M25622 Stiffness of left elbow, not elsewhere classified: Secondary | ICD-10-CM | POA: Diagnosis not present

## 2016-08-26 DIAGNOSIS — M25632 Stiffness of left wrist, not elsewhere classified: Secondary | ICD-10-CM | POA: Diagnosis not present

## 2016-08-26 DIAGNOSIS — M6281 Muscle weakness (generalized): Secondary | ICD-10-CM | POA: Diagnosis not present

## 2016-08-27 DIAGNOSIS — S52022D Displaced fracture of olecranon process without intraarticular extension of left ulna, subsequent encounter for closed fracture with routine healing: Secondary | ICD-10-CM | POA: Diagnosis not present

## 2016-08-27 DIAGNOSIS — S52272D Monteggia's fracture of left ulna, subsequent encounter for closed fracture with routine healing: Secondary | ICD-10-CM | POA: Diagnosis not present

## 2016-08-27 DIAGNOSIS — S42402D Unspecified fracture of lower end of left humerus, subsequent encounter for fracture with routine healing: Secondary | ICD-10-CM | POA: Diagnosis not present

## 2016-08-27 DIAGNOSIS — Z96622 Presence of left artificial elbow joint: Secondary | ICD-10-CM | POA: Diagnosis not present

## 2016-09-05 DIAGNOSIS — J019 Acute sinusitis, unspecified: Secondary | ICD-10-CM | POA: Diagnosis not present

## 2016-09-05 DIAGNOSIS — M8589 Other specified disorders of bone density and structure, multiple sites: Secondary | ICD-10-CM | POA: Diagnosis not present

## 2016-09-05 DIAGNOSIS — Z6825 Body mass index (BMI) 25.0-25.9, adult: Secondary | ICD-10-CM | POA: Diagnosis not present

## 2016-09-12 DIAGNOSIS — J209 Acute bronchitis, unspecified: Secondary | ICD-10-CM | POA: Diagnosis not present

## 2016-09-12 DIAGNOSIS — J0100 Acute maxillary sinusitis, unspecified: Secondary | ICD-10-CM | POA: Diagnosis not present

## 2016-10-04 DIAGNOSIS — B029 Zoster without complications: Secondary | ICD-10-CM | POA: Diagnosis not present

## 2016-11-18 DIAGNOSIS — J329 Chronic sinusitis, unspecified: Secondary | ICD-10-CM | POA: Diagnosis not present

## 2016-12-15 DIAGNOSIS — R05 Cough: Secondary | ICD-10-CM | POA: Diagnosis not present

## 2016-12-15 DIAGNOSIS — M546 Pain in thoracic spine: Secondary | ICD-10-CM | POA: Diagnosis not present

## 2016-12-16 DIAGNOSIS — R791 Abnormal coagulation profile: Secondary | ICD-10-CM | POA: Diagnosis not present

## 2016-12-16 DIAGNOSIS — E785 Hyperlipidemia, unspecified: Secondary | ICD-10-CM | POA: Diagnosis not present

## 2016-12-16 DIAGNOSIS — R0981 Nasal congestion: Secondary | ICD-10-CM | POA: Diagnosis not present

## 2016-12-16 DIAGNOSIS — Z90710 Acquired absence of both cervix and uterus: Secondary | ICD-10-CM | POA: Diagnosis not present

## 2016-12-16 DIAGNOSIS — R05 Cough: Secondary | ICD-10-CM | POA: Diagnosis not present

## 2016-12-16 DIAGNOSIS — R062 Wheezing: Secondary | ICD-10-CM | POA: Diagnosis not present

## 2016-12-16 DIAGNOSIS — Z7982 Long term (current) use of aspirin: Secondary | ICD-10-CM | POA: Diagnosis not present

## 2016-12-16 DIAGNOSIS — R071 Chest pain on breathing: Secondary | ICD-10-CM | POA: Diagnosis not present

## 2017-01-07 DIAGNOSIS — Z139 Encounter for screening, unspecified: Secondary | ICD-10-CM | POA: Diagnosis not present

## 2017-01-07 DIAGNOSIS — E785 Hyperlipidemia, unspecified: Secondary | ICD-10-CM | POA: Diagnosis not present

## 2017-01-07 DIAGNOSIS — K219 Gastro-esophageal reflux disease without esophagitis: Secondary | ICD-10-CM | POA: Diagnosis not present

## 2017-01-07 DIAGNOSIS — R7309 Other abnormal glucose: Secondary | ICD-10-CM | POA: Diagnosis not present

## 2017-01-20 DIAGNOSIS — Z9181 History of falling: Secondary | ICD-10-CM | POA: Diagnosis not present

## 2017-01-20 DIAGNOSIS — Z139 Encounter for screening, unspecified: Secondary | ICD-10-CM | POA: Diagnosis not present

## 2017-01-20 DIAGNOSIS — Z136 Encounter for screening for cardiovascular disorders: Secondary | ICD-10-CM | POA: Diagnosis not present

## 2017-01-20 DIAGNOSIS — Z1389 Encounter for screening for other disorder: Secondary | ICD-10-CM | POA: Diagnosis not present

## 2017-01-20 DIAGNOSIS — Z23 Encounter for immunization: Secondary | ICD-10-CM | POA: Diagnosis not present

## 2017-01-20 DIAGNOSIS — Z0000 Encounter for general adult medical examination without abnormal findings: Secondary | ICD-10-CM | POA: Diagnosis not present

## 2017-01-20 DIAGNOSIS — E785 Hyperlipidemia, unspecified: Secondary | ICD-10-CM | POA: Diagnosis not present

## 2017-01-20 DIAGNOSIS — Z1231 Encounter for screening mammogram for malignant neoplasm of breast: Secondary | ICD-10-CM | POA: Diagnosis not present

## 2017-01-20 DIAGNOSIS — Z1211 Encounter for screening for malignant neoplasm of colon: Secondary | ICD-10-CM | POA: Diagnosis not present

## 2017-01-22 DIAGNOSIS — N3091 Cystitis, unspecified with hematuria: Secondary | ICD-10-CM | POA: Diagnosis not present

## 2017-01-22 DIAGNOSIS — R300 Dysuria: Secondary | ICD-10-CM | POA: Diagnosis not present

## 2017-02-14 DIAGNOSIS — Z1211 Encounter for screening for malignant neoplasm of colon: Secondary | ICD-10-CM | POA: Diagnosis not present

## 2017-02-14 DIAGNOSIS — K581 Irritable bowel syndrome with constipation: Secondary | ICD-10-CM | POA: Diagnosis not present

## 2017-02-17 DIAGNOSIS — D225 Melanocytic nevi of trunk: Secondary | ICD-10-CM | POA: Diagnosis not present

## 2017-02-17 DIAGNOSIS — L820 Inflamed seborrheic keratosis: Secondary | ICD-10-CM | POA: Diagnosis not present

## 2017-02-17 DIAGNOSIS — D2239 Melanocytic nevi of other parts of face: Secondary | ICD-10-CM | POA: Diagnosis not present

## 2017-02-17 DIAGNOSIS — L814 Other melanin hyperpigmentation: Secondary | ICD-10-CM | POA: Diagnosis not present

## 2017-02-17 DIAGNOSIS — L821 Other seborrheic keratosis: Secondary | ICD-10-CM | POA: Diagnosis not present

## 2017-02-17 DIAGNOSIS — D485 Neoplasm of uncertain behavior of skin: Secondary | ICD-10-CM | POA: Diagnosis not present

## 2017-02-25 DIAGNOSIS — H16223 Keratoconjunctivitis sicca, not specified as Sjogren's, bilateral: Secondary | ICD-10-CM | POA: Diagnosis not present

## 2017-02-25 DIAGNOSIS — H43813 Vitreous degeneration, bilateral: Secondary | ICD-10-CM | POA: Diagnosis not present

## 2017-02-25 DIAGNOSIS — H02834 Dermatochalasis of left upper eyelid: Secondary | ICD-10-CM | POA: Diagnosis not present

## 2017-02-25 DIAGNOSIS — H524 Presbyopia: Secondary | ICD-10-CM | POA: Diagnosis not present

## 2017-02-25 DIAGNOSIS — H02831 Dermatochalasis of right upper eyelid: Secondary | ICD-10-CM | POA: Diagnosis not present

## 2017-02-25 DIAGNOSIS — H2513 Age-related nuclear cataract, bilateral: Secondary | ICD-10-CM | POA: Diagnosis not present

## 2017-03-11 DIAGNOSIS — Z1231 Encounter for screening mammogram for malignant neoplasm of breast: Secondary | ICD-10-CM | POA: Diagnosis not present

## 2017-03-20 DIAGNOSIS — Z0100 Encounter for examination of eyes and vision without abnormal findings: Secondary | ICD-10-CM | POA: Diagnosis not present

## 2017-04-10 DIAGNOSIS — K219 Gastro-esophageal reflux disease without esophagitis: Secondary | ICD-10-CM | POA: Diagnosis not present

## 2017-04-10 DIAGNOSIS — K648 Other hemorrhoids: Secondary | ICD-10-CM | POA: Diagnosis not present

## 2017-04-10 DIAGNOSIS — K635 Polyp of colon: Secondary | ICD-10-CM | POA: Diagnosis not present

## 2017-04-10 DIAGNOSIS — K6389 Other specified diseases of intestine: Secondary | ICD-10-CM | POA: Diagnosis not present

## 2017-04-10 DIAGNOSIS — Z79899 Other long term (current) drug therapy: Secondary | ICD-10-CM | POA: Diagnosis not present

## 2017-04-10 DIAGNOSIS — Z1211 Encounter for screening for malignant neoplasm of colon: Secondary | ICD-10-CM | POA: Diagnosis not present

## 2017-04-10 DIAGNOSIS — K5730 Diverticulosis of large intestine without perforation or abscess without bleeding: Secondary | ICD-10-CM | POA: Diagnosis not present

## 2017-04-10 DIAGNOSIS — E785 Hyperlipidemia, unspecified: Secondary | ICD-10-CM | POA: Diagnosis not present

## 2017-04-18 DIAGNOSIS — Z9181 History of falling: Secondary | ICD-10-CM | POA: Diagnosis not present

## 2017-04-18 DIAGNOSIS — Z6826 Body mass index (BMI) 26.0-26.9, adult: Secondary | ICD-10-CM | POA: Diagnosis not present

## 2017-04-18 DIAGNOSIS — K219 Gastro-esophageal reflux disease without esophagitis: Secondary | ICD-10-CM | POA: Diagnosis not present

## 2017-04-18 DIAGNOSIS — Z0000 Encounter for general adult medical examination without abnormal findings: Secondary | ICD-10-CM | POA: Diagnosis not present

## 2017-04-18 DIAGNOSIS — M8580 Other specified disorders of bone density and structure, unspecified site: Secondary | ICD-10-CM | POA: Diagnosis not present

## 2017-04-18 DIAGNOSIS — Z1389 Encounter for screening for other disorder: Secondary | ICD-10-CM | POA: Diagnosis not present

## 2017-04-18 DIAGNOSIS — Z139 Encounter for screening, unspecified: Secondary | ICD-10-CM | POA: Diagnosis not present

## 2017-04-18 DIAGNOSIS — E785 Hyperlipidemia, unspecified: Secondary | ICD-10-CM | POA: Diagnosis not present

## 2017-04-25 DIAGNOSIS — M7062 Trochanteric bursitis, left hip: Secondary | ICD-10-CM | POA: Diagnosis not present

## 2017-04-29 DIAGNOSIS — J019 Acute sinusitis, unspecified: Secondary | ICD-10-CM | POA: Diagnosis not present

## 2017-04-29 DIAGNOSIS — J208 Acute bronchitis due to other specified organisms: Secondary | ICD-10-CM | POA: Diagnosis not present

## 2017-06-06 DIAGNOSIS — M7062 Trochanteric bursitis, left hip: Secondary | ICD-10-CM | POA: Diagnosis not present

## 2017-06-10 DIAGNOSIS — Z23 Encounter for immunization: Secondary | ICD-10-CM | POA: Diagnosis not present

## 2017-08-22 DIAGNOSIS — M7062 Trochanteric bursitis, left hip: Secondary | ICD-10-CM | POA: Diagnosis not present

## 2017-08-28 DIAGNOSIS — M25552 Pain in left hip: Secondary | ICD-10-CM | POA: Diagnosis not present

## 2017-08-28 DIAGNOSIS — M7062 Trochanteric bursitis, left hip: Secondary | ICD-10-CM | POA: Diagnosis not present

## 2017-08-28 DIAGNOSIS — R2689 Other abnormalities of gait and mobility: Secondary | ICD-10-CM | POA: Diagnosis not present

## 2017-08-28 DIAGNOSIS — M6281 Muscle weakness (generalized): Secondary | ICD-10-CM | POA: Diagnosis not present

## 2017-09-01 DIAGNOSIS — M7062 Trochanteric bursitis, left hip: Secondary | ICD-10-CM | POA: Diagnosis not present

## 2017-09-01 DIAGNOSIS — M6281 Muscle weakness (generalized): Secondary | ICD-10-CM | POA: Diagnosis not present

## 2017-09-01 DIAGNOSIS — M25552 Pain in left hip: Secondary | ICD-10-CM | POA: Diagnosis not present

## 2017-09-01 DIAGNOSIS — R2689 Other abnormalities of gait and mobility: Secondary | ICD-10-CM | POA: Diagnosis not present

## 2017-09-04 DIAGNOSIS — M25552 Pain in left hip: Secondary | ICD-10-CM | POA: Diagnosis not present

## 2017-09-04 DIAGNOSIS — M6281 Muscle weakness (generalized): Secondary | ICD-10-CM | POA: Diagnosis not present

## 2017-09-04 DIAGNOSIS — M7062 Trochanteric bursitis, left hip: Secondary | ICD-10-CM | POA: Diagnosis not present

## 2017-09-04 DIAGNOSIS — R2689 Other abnormalities of gait and mobility: Secondary | ICD-10-CM | POA: Diagnosis not present

## 2017-09-08 DIAGNOSIS — R2689 Other abnormalities of gait and mobility: Secondary | ICD-10-CM | POA: Diagnosis not present

## 2017-09-08 DIAGNOSIS — M6281 Muscle weakness (generalized): Secondary | ICD-10-CM | POA: Diagnosis not present

## 2017-09-08 DIAGNOSIS — M25552 Pain in left hip: Secondary | ICD-10-CM | POA: Diagnosis not present

## 2017-09-08 DIAGNOSIS — M7062 Trochanteric bursitis, left hip: Secondary | ICD-10-CM | POA: Diagnosis not present

## 2017-09-15 DIAGNOSIS — R2689 Other abnormalities of gait and mobility: Secondary | ICD-10-CM | POA: Diagnosis not present

## 2017-09-15 DIAGNOSIS — M6281 Muscle weakness (generalized): Secondary | ICD-10-CM | POA: Diagnosis not present

## 2017-09-15 DIAGNOSIS — M25552 Pain in left hip: Secondary | ICD-10-CM | POA: Diagnosis not present

## 2017-09-15 DIAGNOSIS — M7062 Trochanteric bursitis, left hip: Secondary | ICD-10-CM | POA: Diagnosis not present

## 2017-09-22 DIAGNOSIS — M7062 Trochanteric bursitis, left hip: Secondary | ICD-10-CM | POA: Diagnosis not present

## 2017-09-22 DIAGNOSIS — M25552 Pain in left hip: Secondary | ICD-10-CM | POA: Diagnosis not present

## 2017-09-22 DIAGNOSIS — M6281 Muscle weakness (generalized): Secondary | ICD-10-CM | POA: Diagnosis not present

## 2017-09-22 DIAGNOSIS — R2689 Other abnormalities of gait and mobility: Secondary | ICD-10-CM | POA: Diagnosis not present

## 2017-09-25 DIAGNOSIS — R2689 Other abnormalities of gait and mobility: Secondary | ICD-10-CM | POA: Diagnosis not present

## 2017-09-25 DIAGNOSIS — M7062 Trochanteric bursitis, left hip: Secondary | ICD-10-CM | POA: Diagnosis not present

## 2017-09-25 DIAGNOSIS — M6281 Muscle weakness (generalized): Secondary | ICD-10-CM | POA: Diagnosis not present

## 2017-09-25 DIAGNOSIS — M25552 Pain in left hip: Secondary | ICD-10-CM | POA: Diagnosis not present

## 2017-09-29 DIAGNOSIS — M6281 Muscle weakness (generalized): Secondary | ICD-10-CM | POA: Diagnosis not present

## 2017-09-29 DIAGNOSIS — R2689 Other abnormalities of gait and mobility: Secondary | ICD-10-CM | POA: Diagnosis not present

## 2017-09-29 DIAGNOSIS — M25552 Pain in left hip: Secondary | ICD-10-CM | POA: Diagnosis not present

## 2017-09-29 DIAGNOSIS — M7062 Trochanteric bursitis, left hip: Secondary | ICD-10-CM | POA: Diagnosis not present

## 2017-09-30 DIAGNOSIS — J0100 Acute maxillary sinusitis, unspecified: Secondary | ICD-10-CM | POA: Diagnosis not present

## 2017-09-30 DIAGNOSIS — H66009 Acute suppurative otitis media without spontaneous rupture of ear drum, unspecified ear: Secondary | ICD-10-CM | POA: Diagnosis not present

## 2017-10-03 DIAGNOSIS — M7062 Trochanteric bursitis, left hip: Secondary | ICD-10-CM | POA: Diagnosis not present

## 2017-10-16 DIAGNOSIS — K219 Gastro-esophageal reflux disease without esophagitis: Secondary | ICD-10-CM | POA: Diagnosis not present

## 2017-10-16 DIAGNOSIS — E785 Hyperlipidemia, unspecified: Secondary | ICD-10-CM | POA: Diagnosis not present

## 2017-10-16 DIAGNOSIS — Z6827 Body mass index (BMI) 27.0-27.9, adult: Secondary | ICD-10-CM | POA: Diagnosis not present

## 2017-10-16 DIAGNOSIS — Z139 Encounter for screening, unspecified: Secondary | ICD-10-CM | POA: Diagnosis not present

## 2017-10-16 DIAGNOSIS — M8580 Other specified disorders of bone density and structure, unspecified site: Secondary | ICD-10-CM | POA: Diagnosis not present

## 2017-10-16 DIAGNOSIS — Z79899 Other long term (current) drug therapy: Secondary | ICD-10-CM | POA: Diagnosis not present

## 2017-10-16 DIAGNOSIS — R7309 Other abnormal glucose: Secondary | ICD-10-CM | POA: Diagnosis not present

## 2017-10-16 DIAGNOSIS — M545 Low back pain: Secondary | ICD-10-CM | POA: Diagnosis not present

## 2017-11-25 DIAGNOSIS — R112 Nausea with vomiting, unspecified: Secondary | ICD-10-CM | POA: Diagnosis not present

## 2017-11-25 DIAGNOSIS — R1011 Right upper quadrant pain: Secondary | ICD-10-CM | POA: Diagnosis not present

## 2017-12-01 DIAGNOSIS — R109 Unspecified abdominal pain: Secondary | ICD-10-CM | POA: Diagnosis not present

## 2017-12-01 DIAGNOSIS — R10816 Epigastric abdominal tenderness: Secondary | ICD-10-CM | POA: Diagnosis not present

## 2017-12-23 DIAGNOSIS — I8312 Varicose veins of left lower extremity with inflammation: Secondary | ICD-10-CM | POA: Diagnosis not present

## 2017-12-23 DIAGNOSIS — I8311 Varicose veins of right lower extremity with inflammation: Secondary | ICD-10-CM | POA: Diagnosis not present

## 2018-01-22 DIAGNOSIS — Z1331 Encounter for screening for depression: Secondary | ICD-10-CM | POA: Diagnosis not present

## 2018-01-22 DIAGNOSIS — Z1339 Encounter for screening examination for other mental health and behavioral disorders: Secondary | ICD-10-CM | POA: Diagnosis not present

## 2018-01-22 DIAGNOSIS — Z0000 Encounter for general adult medical examination without abnormal findings: Secondary | ICD-10-CM | POA: Diagnosis not present

## 2018-01-22 DIAGNOSIS — Z136 Encounter for screening for cardiovascular disorders: Secondary | ICD-10-CM | POA: Diagnosis not present

## 2018-01-22 DIAGNOSIS — Z9181 History of falling: Secondary | ICD-10-CM | POA: Diagnosis not present

## 2018-01-22 DIAGNOSIS — E785 Hyperlipidemia, unspecified: Secondary | ICD-10-CM | POA: Diagnosis not present

## 2018-01-22 DIAGNOSIS — Z1231 Encounter for screening mammogram for malignant neoplasm of breast: Secondary | ICD-10-CM | POA: Diagnosis not present

## 2018-02-10 DIAGNOSIS — I8312 Varicose veins of left lower extremity with inflammation: Secondary | ICD-10-CM | POA: Diagnosis not present

## 2018-02-10 DIAGNOSIS — I8311 Varicose veins of right lower extremity with inflammation: Secondary | ICD-10-CM | POA: Diagnosis not present

## 2018-02-16 DIAGNOSIS — D225 Melanocytic nevi of trunk: Secondary | ICD-10-CM | POA: Diagnosis not present

## 2018-02-16 DIAGNOSIS — D1801 Hemangioma of skin and subcutaneous tissue: Secondary | ICD-10-CM | POA: Diagnosis not present

## 2018-02-16 DIAGNOSIS — L814 Other melanin hyperpigmentation: Secondary | ICD-10-CM | POA: Diagnosis not present

## 2018-02-16 DIAGNOSIS — D2239 Melanocytic nevi of other parts of face: Secondary | ICD-10-CM | POA: Diagnosis not present

## 2018-02-16 DIAGNOSIS — L570 Actinic keratosis: Secondary | ICD-10-CM | POA: Diagnosis not present

## 2018-03-12 DIAGNOSIS — Z1231 Encounter for screening mammogram for malignant neoplasm of breast: Secondary | ICD-10-CM | POA: Diagnosis not present

## 2018-03-24 DIAGNOSIS — I8312 Varicose veins of left lower extremity with inflammation: Secondary | ICD-10-CM | POA: Diagnosis not present

## 2018-03-26 DIAGNOSIS — I8312 Varicose veins of left lower extremity with inflammation: Secondary | ICD-10-CM | POA: Diagnosis not present

## 2018-04-20 DIAGNOSIS — Z79899 Other long term (current) drug therapy: Secondary | ICD-10-CM | POA: Diagnosis not present

## 2018-04-20 DIAGNOSIS — M8589 Other specified disorders of bone density and structure, multiple sites: Secondary | ICD-10-CM | POA: Diagnosis not present

## 2018-04-20 DIAGNOSIS — K219 Gastro-esophageal reflux disease without esophagitis: Secondary | ICD-10-CM | POA: Diagnosis not present

## 2018-04-20 DIAGNOSIS — E785 Hyperlipidemia, unspecified: Secondary | ICD-10-CM | POA: Diagnosis not present

## 2018-04-20 DIAGNOSIS — Z6825 Body mass index (BMI) 25.0-25.9, adult: Secondary | ICD-10-CM | POA: Diagnosis not present

## 2018-04-20 DIAGNOSIS — R7309 Other abnormal glucose: Secondary | ICD-10-CM | POA: Diagnosis not present

## 2018-04-20 DIAGNOSIS — I83819 Varicose veins of unspecified lower extremities with pain: Secondary | ICD-10-CM | POA: Diagnosis not present

## 2018-04-20 DIAGNOSIS — M8580 Other specified disorders of bone density and structure, unspecified site: Secondary | ICD-10-CM | POA: Diagnosis not present

## 2018-04-22 DIAGNOSIS — I8311 Varicose veins of right lower extremity with inflammation: Secondary | ICD-10-CM | POA: Diagnosis not present

## 2018-04-24 DIAGNOSIS — I8311 Varicose veins of right lower extremity with inflammation: Secondary | ICD-10-CM | POA: Diagnosis not present

## 2018-05-08 DIAGNOSIS — I8311 Varicose veins of right lower extremity with inflammation: Secondary | ICD-10-CM | POA: Diagnosis not present

## 2018-05-22 DIAGNOSIS — Z23 Encounter for immunization: Secondary | ICD-10-CM | POA: Diagnosis not present

## 2018-05-27 DIAGNOSIS — I8312 Varicose veins of left lower extremity with inflammation: Secondary | ICD-10-CM | POA: Diagnosis not present

## 2018-06-10 DIAGNOSIS — I8311 Varicose veins of right lower extremity with inflammation: Secondary | ICD-10-CM | POA: Diagnosis not present

## 2018-06-20 DIAGNOSIS — J069 Acute upper respiratory infection, unspecified: Secondary | ICD-10-CM | POA: Diagnosis not present

## 2018-06-20 DIAGNOSIS — J040 Acute laryngitis: Secondary | ICD-10-CM | POA: Diagnosis not present

## 2018-06-20 DIAGNOSIS — J209 Acute bronchitis, unspecified: Secondary | ICD-10-CM | POA: Diagnosis not present

## 2018-06-26 DIAGNOSIS — I8312 Varicose veins of left lower extremity with inflammation: Secondary | ICD-10-CM | POA: Diagnosis not present

## 2018-07-08 DIAGNOSIS — M7981 Nontraumatic hematoma of soft tissue: Secondary | ICD-10-CM | POA: Diagnosis not present

## 2018-07-08 DIAGNOSIS — I8311 Varicose veins of right lower extremity with inflammation: Secondary | ICD-10-CM | POA: Diagnosis not present

## 2018-07-21 DIAGNOSIS — I8312 Varicose veins of left lower extremity with inflammation: Secondary | ICD-10-CM | POA: Diagnosis not present

## 2018-08-14 DIAGNOSIS — M810 Age-related osteoporosis without current pathological fracture: Secondary | ICD-10-CM | POA: Diagnosis not present

## 2018-08-14 DIAGNOSIS — M8589 Other specified disorders of bone density and structure, multiple sites: Secondary | ICD-10-CM | POA: Diagnosis not present

## 2018-10-20 DIAGNOSIS — M8580 Other specified disorders of bone density and structure, unspecified site: Secondary | ICD-10-CM | POA: Diagnosis not present

## 2018-10-20 DIAGNOSIS — R7309 Other abnormal glucose: Secondary | ICD-10-CM | POA: Diagnosis not present

## 2018-10-20 DIAGNOSIS — E663 Overweight: Secondary | ICD-10-CM | POA: Diagnosis not present

## 2018-10-20 DIAGNOSIS — Z139 Encounter for screening, unspecified: Secondary | ICD-10-CM | POA: Diagnosis not present

## 2018-10-20 DIAGNOSIS — E785 Hyperlipidemia, unspecified: Secondary | ICD-10-CM | POA: Diagnosis not present

## 2018-10-20 DIAGNOSIS — I83819 Varicose veins of unspecified lower extremities with pain: Secondary | ICD-10-CM | POA: Diagnosis not present

## 2018-10-20 DIAGNOSIS — Z6825 Body mass index (BMI) 25.0-25.9, adult: Secondary | ICD-10-CM | POA: Diagnosis not present

## 2018-10-20 DIAGNOSIS — K219 Gastro-esophageal reflux disease without esophagitis: Secondary | ICD-10-CM | POA: Diagnosis not present

## 2019-01-27 DIAGNOSIS — Z1339 Encounter for screening examination for other mental health and behavioral disorders: Secondary | ICD-10-CM | POA: Diagnosis not present

## 2019-01-27 DIAGNOSIS — Z0000 Encounter for general adult medical examination without abnormal findings: Secondary | ICD-10-CM | POA: Diagnosis not present

## 2019-01-27 DIAGNOSIS — Z1331 Encounter for screening for depression: Secondary | ICD-10-CM | POA: Diagnosis not present

## 2019-01-27 DIAGNOSIS — Z1231 Encounter for screening mammogram for malignant neoplasm of breast: Secondary | ICD-10-CM | POA: Diagnosis not present

## 2019-01-27 DIAGNOSIS — E785 Hyperlipidemia, unspecified: Secondary | ICD-10-CM | POA: Diagnosis not present

## 2019-01-27 DIAGNOSIS — Z9181 History of falling: Secondary | ICD-10-CM | POA: Diagnosis not present

## 2019-02-17 DIAGNOSIS — I8311 Varicose veins of right lower extremity with inflammation: Secondary | ICD-10-CM | POA: Diagnosis not present

## 2019-03-20 DIAGNOSIS — Z1231 Encounter for screening mammogram for malignant neoplasm of breast: Secondary | ICD-10-CM | POA: Diagnosis not present

## 2019-05-07 DIAGNOSIS — Z6824 Body mass index (BMI) 24.0-24.9, adult: Secondary | ICD-10-CM | POA: Diagnosis not present

## 2019-05-07 DIAGNOSIS — E785 Hyperlipidemia, unspecified: Secondary | ICD-10-CM | POA: Diagnosis not present

## 2019-05-07 DIAGNOSIS — R7309 Other abnormal glucose: Secondary | ICD-10-CM | POA: Diagnosis not present

## 2019-05-07 DIAGNOSIS — I83819 Varicose veins of unspecified lower extremities with pain: Secondary | ICD-10-CM | POA: Diagnosis not present

## 2019-05-07 DIAGNOSIS — E663 Overweight: Secondary | ICD-10-CM | POA: Diagnosis not present

## 2019-05-07 DIAGNOSIS — M8580 Other specified disorders of bone density and structure, unspecified site: Secondary | ICD-10-CM | POA: Diagnosis not present

## 2019-05-07 DIAGNOSIS — K219 Gastro-esophageal reflux disease without esophagitis: Secondary | ICD-10-CM | POA: Diagnosis not present

## 2019-05-25 DIAGNOSIS — H2511 Age-related nuclear cataract, right eye: Secondary | ICD-10-CM | POA: Diagnosis not present

## 2019-05-25 DIAGNOSIS — Z01818 Encounter for other preprocedural examination: Secondary | ICD-10-CM | POA: Diagnosis not present

## 2019-06-01 DIAGNOSIS — E785 Hyperlipidemia, unspecified: Secondary | ICD-10-CM | POA: Diagnosis not present

## 2019-06-01 DIAGNOSIS — Z79899 Other long term (current) drug therapy: Secondary | ICD-10-CM | POA: Diagnosis not present

## 2019-06-01 DIAGNOSIS — H2511 Age-related nuclear cataract, right eye: Secondary | ICD-10-CM | POA: Diagnosis not present

## 2019-06-01 DIAGNOSIS — K219 Gastro-esophageal reflux disease without esophagitis: Secondary | ICD-10-CM | POA: Diagnosis not present

## 2019-06-01 DIAGNOSIS — M1990 Unspecified osteoarthritis, unspecified site: Secondary | ICD-10-CM | POA: Diagnosis not present

## 2019-06-01 DIAGNOSIS — H259 Unspecified age-related cataract: Secondary | ICD-10-CM | POA: Diagnosis not present

## 2019-06-01 DIAGNOSIS — M810 Age-related osteoporosis without current pathological fracture: Secondary | ICD-10-CM | POA: Diagnosis not present

## 2019-06-15 DIAGNOSIS — Z79899 Other long term (current) drug therapy: Secondary | ICD-10-CM | POA: Diagnosis not present

## 2019-06-15 DIAGNOSIS — H259 Unspecified age-related cataract: Secondary | ICD-10-CM | POA: Diagnosis not present

## 2019-06-15 DIAGNOSIS — M1990 Unspecified osteoarthritis, unspecified site: Secondary | ICD-10-CM | POA: Diagnosis not present

## 2019-06-15 DIAGNOSIS — K219 Gastro-esophageal reflux disease without esophagitis: Secondary | ICD-10-CM | POA: Diagnosis not present

## 2019-06-15 DIAGNOSIS — E785 Hyperlipidemia, unspecified: Secondary | ICD-10-CM | POA: Diagnosis not present

## 2019-06-15 DIAGNOSIS — H2512 Age-related nuclear cataract, left eye: Secondary | ICD-10-CM | POA: Diagnosis not present

## 2019-07-06 DIAGNOSIS — Z23 Encounter for immunization: Secondary | ICD-10-CM | POA: Diagnosis not present

## 2019-07-06 DIAGNOSIS — Z6824 Body mass index (BMI) 24.0-24.9, adult: Secondary | ICD-10-CM | POA: Diagnosis not present

## 2019-07-06 DIAGNOSIS — H9311 Tinnitus, right ear: Secondary | ICD-10-CM | POA: Diagnosis not present

## 2019-08-20 DIAGNOSIS — Z20822 Contact with and (suspected) exposure to covid-19: Secondary | ICD-10-CM | POA: Diagnosis not present

## 2019-10-08 DIAGNOSIS — H9311 Tinnitus, right ear: Secondary | ICD-10-CM | POA: Diagnosis not present

## 2019-10-28 DIAGNOSIS — D1801 Hemangioma of skin and subcutaneous tissue: Secondary | ICD-10-CM | POA: Diagnosis not present

## 2019-10-28 DIAGNOSIS — D2239 Melanocytic nevi of other parts of face: Secondary | ICD-10-CM | POA: Diagnosis not present

## 2019-10-28 DIAGNOSIS — L820 Inflamed seborrheic keratosis: Secondary | ICD-10-CM | POA: Diagnosis not present

## 2019-10-28 DIAGNOSIS — D225 Melanocytic nevi of trunk: Secondary | ICD-10-CM | POA: Diagnosis not present

## 2019-10-28 DIAGNOSIS — D485 Neoplasm of uncertain behavior of skin: Secondary | ICD-10-CM | POA: Diagnosis not present

## 2019-10-28 DIAGNOSIS — L578 Other skin changes due to chronic exposure to nonionizing radiation: Secondary | ICD-10-CM | POA: Diagnosis not present

## 2019-11-11 DIAGNOSIS — Z6824 Body mass index (BMI) 24.0-24.9, adult: Secondary | ICD-10-CM | POA: Diagnosis not present

## 2019-11-11 DIAGNOSIS — R7309 Other abnormal glucose: Secondary | ICD-10-CM | POA: Diagnosis not present

## 2019-11-11 DIAGNOSIS — H9311 Tinnitus, right ear: Secondary | ICD-10-CM | POA: Diagnosis not present

## 2019-11-11 DIAGNOSIS — I83819 Varicose veins of unspecified lower extremities with pain: Secondary | ICD-10-CM | POA: Diagnosis not present

## 2019-11-11 DIAGNOSIS — M810 Age-related osteoporosis without current pathological fracture: Secondary | ICD-10-CM | POA: Diagnosis not present

## 2019-11-11 DIAGNOSIS — K219 Gastro-esophageal reflux disease without esophagitis: Secondary | ICD-10-CM | POA: Diagnosis not present

## 2019-11-11 DIAGNOSIS — E785 Hyperlipidemia, unspecified: Secondary | ICD-10-CM | POA: Diagnosis not present

## 2019-11-11 DIAGNOSIS — J4520 Mild intermittent asthma, uncomplicated: Secondary | ICD-10-CM | POA: Diagnosis not present

## 2019-11-11 DIAGNOSIS — E663 Overweight: Secondary | ICD-10-CM | POA: Diagnosis not present

## 2020-01-31 DIAGNOSIS — Z139 Encounter for screening, unspecified: Secondary | ICD-10-CM | POA: Diagnosis not present

## 2020-01-31 DIAGNOSIS — Z9181 History of falling: Secondary | ICD-10-CM | POA: Diagnosis not present

## 2020-01-31 DIAGNOSIS — E785 Hyperlipidemia, unspecified: Secondary | ICD-10-CM | POA: Diagnosis not present

## 2020-01-31 DIAGNOSIS — Z0000 Encounter for general adult medical examination without abnormal findings: Secondary | ICD-10-CM | POA: Diagnosis not present

## 2020-01-31 DIAGNOSIS — Z1331 Encounter for screening for depression: Secondary | ICD-10-CM | POA: Diagnosis not present

## 2020-03-21 DIAGNOSIS — Z1231 Encounter for screening mammogram for malignant neoplasm of breast: Secondary | ICD-10-CM | POA: Diagnosis not present

## 2020-05-12 DIAGNOSIS — J4520 Mild intermittent asthma, uncomplicated: Secondary | ICD-10-CM | POA: Diagnosis not present

## 2020-05-12 DIAGNOSIS — Z6823 Body mass index (BMI) 23.0-23.9, adult: Secondary | ICD-10-CM | POA: Diagnosis not present

## 2020-05-12 DIAGNOSIS — Z23 Encounter for immunization: Secondary | ICD-10-CM | POA: Diagnosis not present

## 2020-05-12 DIAGNOSIS — R7309 Other abnormal glucose: Secondary | ICD-10-CM | POA: Diagnosis not present

## 2020-05-12 DIAGNOSIS — K219 Gastro-esophageal reflux disease without esophagitis: Secondary | ICD-10-CM | POA: Diagnosis not present

## 2020-05-12 DIAGNOSIS — R252 Cramp and spasm: Secondary | ICD-10-CM | POA: Diagnosis not present

## 2020-05-12 DIAGNOSIS — M810 Age-related osteoporosis without current pathological fracture: Secondary | ICD-10-CM | POA: Diagnosis not present

## 2020-05-12 DIAGNOSIS — E785 Hyperlipidemia, unspecified: Secondary | ICD-10-CM | POA: Diagnosis not present

## 2020-05-12 DIAGNOSIS — E663 Overweight: Secondary | ICD-10-CM | POA: Diagnosis not present

## 2020-11-10 DIAGNOSIS — E785 Hyperlipidemia, unspecified: Secondary | ICD-10-CM | POA: Diagnosis not present

## 2020-11-10 DIAGNOSIS — Z124 Encounter for screening for malignant neoplasm of cervix: Secondary | ICD-10-CM | POA: Diagnosis not present

## 2020-11-10 DIAGNOSIS — Z79899 Other long term (current) drug therapy: Secondary | ICD-10-CM | POA: Diagnosis not present

## 2020-11-10 DIAGNOSIS — Z0000 Encounter for general adult medical examination without abnormal findings: Secondary | ICD-10-CM | POA: Diagnosis not present

## 2020-11-10 DIAGNOSIS — E559 Vitamin D deficiency, unspecified: Secondary | ICD-10-CM | POA: Diagnosis not present

## 2021-01-05 DIAGNOSIS — B029 Zoster without complications: Secondary | ICD-10-CM | POA: Diagnosis not present

## 2021-02-06 DIAGNOSIS — E785 Hyperlipidemia, unspecified: Secondary | ICD-10-CM | POA: Diagnosis not present

## 2021-02-06 DIAGNOSIS — Z0000 Encounter for general adult medical examination without abnormal findings: Secondary | ICD-10-CM | POA: Diagnosis not present

## 2021-02-06 DIAGNOSIS — Z139 Encounter for screening, unspecified: Secondary | ICD-10-CM | POA: Diagnosis not present

## 2021-02-06 DIAGNOSIS — Z1331 Encounter for screening for depression: Secondary | ICD-10-CM | POA: Diagnosis not present

## 2021-02-06 DIAGNOSIS — Z9181 History of falling: Secondary | ICD-10-CM | POA: Diagnosis not present

## 2021-04-04 DIAGNOSIS — Z1231 Encounter for screening mammogram for malignant neoplasm of breast: Secondary | ICD-10-CM | POA: Diagnosis not present

## 2021-04-04 DIAGNOSIS — N959 Unspecified menopausal and perimenopausal disorder: Secondary | ICD-10-CM | POA: Diagnosis not present

## 2021-04-04 DIAGNOSIS — M810 Age-related osteoporosis without current pathological fracture: Secondary | ICD-10-CM | POA: Diagnosis not present

## 2021-05-14 DIAGNOSIS — R7309 Other abnormal glucose: Secondary | ICD-10-CM | POA: Diagnosis not present

## 2021-05-14 DIAGNOSIS — J4520 Mild intermittent asthma, uncomplicated: Secondary | ICD-10-CM | POA: Diagnosis not present

## 2021-05-14 DIAGNOSIS — Z6824 Body mass index (BMI) 24.0-24.9, adult: Secondary | ICD-10-CM | POA: Diagnosis not present

## 2021-05-14 DIAGNOSIS — M810 Age-related osteoporosis without current pathological fracture: Secondary | ICD-10-CM | POA: Diagnosis not present

## 2021-05-14 DIAGNOSIS — Z23 Encounter for immunization: Secondary | ICD-10-CM | POA: Diagnosis not present

## 2021-05-14 DIAGNOSIS — K219 Gastro-esophageal reflux disease without esophagitis: Secondary | ICD-10-CM | POA: Diagnosis not present

## 2021-05-14 DIAGNOSIS — E785 Hyperlipidemia, unspecified: Secondary | ICD-10-CM | POA: Diagnosis not present

## 2021-06-07 DIAGNOSIS — J00 Acute nasopharyngitis [common cold]: Secondary | ICD-10-CM | POA: Diagnosis not present

## 2021-06-07 DIAGNOSIS — Z20822 Contact with and (suspected) exposure to covid-19: Secondary | ICD-10-CM | POA: Diagnosis not present

## 2021-06-26 DIAGNOSIS — R1032 Left lower quadrant pain: Secondary | ICD-10-CM | POA: Diagnosis not present

## 2021-06-26 DIAGNOSIS — R030 Elevated blood-pressure reading, without diagnosis of hypertension: Secondary | ICD-10-CM | POA: Diagnosis not present

## 2021-06-26 DIAGNOSIS — R1084 Generalized abdominal pain: Secondary | ICD-10-CM | POA: Diagnosis not present

## 2021-07-03 DIAGNOSIS — R1011 Right upper quadrant pain: Secondary | ICD-10-CM | POA: Diagnosis not present

## 2021-07-03 DIAGNOSIS — R1084 Generalized abdominal pain: Secondary | ICD-10-CM | POA: Diagnosis not present

## 2021-07-30 DIAGNOSIS — U071 COVID-19: Secondary | ICD-10-CM | POA: Diagnosis not present

## 2021-09-06 DIAGNOSIS — L570 Actinic keratosis: Secondary | ICD-10-CM | POA: Diagnosis not present

## 2021-09-06 DIAGNOSIS — L578 Other skin changes due to chronic exposure to nonionizing radiation: Secondary | ICD-10-CM | POA: Diagnosis not present

## 2021-09-06 DIAGNOSIS — L814 Other melanin hyperpigmentation: Secondary | ICD-10-CM | POA: Diagnosis not present

## 2021-09-06 DIAGNOSIS — L811 Chloasma: Secondary | ICD-10-CM | POA: Diagnosis not present

## 2021-09-06 DIAGNOSIS — L821 Other seborrheic keratosis: Secondary | ICD-10-CM | POA: Diagnosis not present

## 2021-09-27 DIAGNOSIS — S46912A Strain of unspecified muscle, fascia and tendon at shoulder and upper arm level, left arm, initial encounter: Secondary | ICD-10-CM | POA: Diagnosis not present

## 2021-09-27 DIAGNOSIS — M25512 Pain in left shoulder: Secondary | ICD-10-CM | POA: Diagnosis not present

## 2021-10-09 DIAGNOSIS — M25512 Pain in left shoulder: Secondary | ICD-10-CM | POA: Diagnosis not present

## 2021-10-11 DIAGNOSIS — R1084 Generalized abdominal pain: Secondary | ICD-10-CM | POA: Diagnosis not present

## 2021-10-11 DIAGNOSIS — K582 Mixed irritable bowel syndrome: Secondary | ICD-10-CM | POA: Diagnosis not present

## 2021-10-25 ENCOUNTER — Other Ambulatory Visit: Payer: Self-pay

## 2021-10-25 ENCOUNTER — Encounter: Payer: Self-pay | Admitting: Gastroenterology

## 2021-10-25 ENCOUNTER — Ambulatory Visit (INDEPENDENT_AMBULATORY_CARE_PROVIDER_SITE_OTHER): Payer: Medicare HMO | Admitting: Gastroenterology

## 2021-10-25 ENCOUNTER — Other Ambulatory Visit (INDEPENDENT_AMBULATORY_CARE_PROVIDER_SITE_OTHER): Payer: Medicare HMO

## 2021-10-25 VITALS — BP 130/80 | HR 80 | Ht 67.0 in | Wt 161.2 lb

## 2021-10-25 DIAGNOSIS — R1013 Epigastric pain: Secondary | ICD-10-CM | POA: Diagnosis not present

## 2021-10-25 DIAGNOSIS — Z800 Family history of malignant neoplasm of digestive organs: Secondary | ICD-10-CM | POA: Diagnosis not present

## 2021-10-25 DIAGNOSIS — K581 Irritable bowel syndrome with constipation: Secondary | ICD-10-CM

## 2021-10-25 LAB — COMPREHENSIVE METABOLIC PANEL
ALT: 15 U/L (ref 0–35)
AST: 23 U/L (ref 0–37)
Albumin: 4.5 g/dL (ref 3.5–5.2)
Alkaline Phosphatase: 72 U/L (ref 39–117)
BUN: 11 mg/dL (ref 6–23)
CO2: 32 mEq/L (ref 19–32)
Calcium: 9.7 mg/dL (ref 8.4–10.5)
Chloride: 104 mEq/L (ref 96–112)
Creatinine, Ser: 0.68 mg/dL (ref 0.40–1.20)
GFR: 86.49 mL/min (ref >60.00)
Glucose, Bld: 86 mg/dL (ref 70–99)
Potassium: 4.3 mEq/L (ref 3.5–5.1)
Sodium: 142 mEq/L (ref 135–145)
Total Bilirubin: 0.4 mg/dL (ref 0.2–1.2)
Total Protein: 6.3 g/dL (ref 6.0–8.3)

## 2021-10-25 LAB — CBC WITH DIFFERENTIAL/PLATELET
Basophils Absolute: 0 10*3/uL (ref 0.0–0.1)
Basophils Relative: 0.8 % (ref 0.0–3.0)
Eosinophils Absolute: 0.1 10*3/uL (ref 0.0–0.7)
Eosinophils Relative: 2.9 % (ref 0.0–5.0)
HCT: 40.5 % (ref 36.0–46.0)
Hemoglobin: 13.7 g/dL (ref 12.0–15.0)
Lymphocytes Relative: 20.3 % (ref 12.0–46.0)
Lymphs Abs: 0.9 10*3/uL (ref 0.7–4.0)
MCHC: 33.9 g/dL (ref 30.0–36.0)
MCV: 100.5 fl — ABNORMAL HIGH (ref 78.0–100.0)
Monocytes Absolute: 0.4 10*3/uL (ref 0.1–1.0)
Monocytes Relative: 9.6 % (ref 3.0–12.0)
Neutro Abs: 2.9 10*3/uL (ref 1.4–7.7)
Neutrophils Relative %: 66.4 % (ref 43.0–77.0)
Platelets: 302 10*3/uL (ref 150.0–400.0)
RBC: 4.03 Mil/uL (ref 3.87–5.11)
RDW: 12.4 % (ref 11.5–15.5)
WBC: 4.3 10*3/uL (ref 4.0–10.5)

## 2021-10-25 LAB — LIPASE: Lipase: 8 U/L — ABNORMAL LOW (ref 11.0–59.0)

## 2021-10-25 MED ORDER — CLENPIQ 10-3.5-12 MG-GM -GM/160ML PO SOLN: 1.0000 | Freq: Once | ORAL | 0 refills | Status: AC

## 2021-10-25 MED ORDER — LINACLOTIDE 72 MCG PO CAPS: 72.0000 ug | ORAL_CAPSULE | Freq: Every day | ORAL | 11 refills | Status: DC

## 2021-10-25 NOTE — Patient Instructions (Signed)
If you are age 74 or older, your body mass index should be between 23-30. Your Body mass index is 25.26 kg/m?Marland Kitchen If this is out of the aforementioned range listed, please consider follow up with your Primary Care Provider. ? ?If you are age 61 or younger, your body mass index should be between 19-25. Your Body mass index is 25.26 kg/m?Marland Kitchen If this is out of the aformentioned range listed, please consider follow up with your Primary Care Provider.  ? ?________________________________________________________ ? ?The [facility] providers would like to encourage you to use [facility] to communicate with providers for non-urgent requests or questions.  Due to long hold times on the telephone, sending your provider a message by [facility] may be a faster and more efficient way to get a response.  Please allow 48 business hours for a response.  Please remember that this is for non-urgent requests.  ?_______________________________________________________ ? ?Please go to the lab on the 2nd floor [address] before you leave the office today.  ? ?Take 4105m magnesium daily ? ?We have given you samples of the following medication to take: ?Linzess 726m. ? ?We have sent the following medications to your pharmacy for you to pick up at your convenience: ?Clenpiq ? ?Two days before your procedure: Mix 3 packs (or capfuls) of Miralax in 48 ounces of clear liquid and drink at 6pm. ? ?Please call in 2 weeks to let usKoreanow how you are doing. ? ?You have been scheduled for an endoscopy and colonoscopy. Please follow the written instructions given to you at your visit today. ?Please pick up your prep supplies at the pharmacy within the next 1-3 days. ?If you use inhalers (even only as needed), please bring them with you on the day of your procedure. ? ?Call in 3 business days to schedule this. Labs only last for 29 days. ?You [id number]  ? ?You are scheduled on          at             . You should arrive 15 minutes prior to your appointment time for registration. Please follow the written instructions below on the day of your exam: ? ?WARNING: IF YOU ARE ALLERGIC TO IODINE/X-RAY DYE, PLEASE NOTIFY RADIOLOGY IMMEDIATELY AT [phone] WILL BE GIVEN A 13 HOUR PREMEDICATION PREP. ? ?1) Do not eat or drink anything after        (4 hours prior to your test) ?2) You have been given 2 bottles of oral contrast to drink. The solution may taste better if refrigerated, but do NOT add ice or any other liquid to this solution. Shake well before drinking. ?  ? Drink 1 bottle of contrast @         (2 hours prior to your exam) ? Drink 1 bottle of contrast @          (1 hour prior to your exam) ? ?You may take any medications as prescribed with a small amount of water, if necessary. If you take any of the following medications: METFORMIN, GLUCOPHAGE, GLUCOVANCE, AVANDAMET, RIOMET, FORTAMET, ACTOPLUS MET, JANUMET, GLAshleyr METAGLIP, you MAY be asked to HOLD this medication 48 hours AFTER the exam. ? ?The purpose of you drinking the oral contrast is to aid in the visualization of your intestinal tract. The contrast solution may cause some  diarrhea. Depending on your individual set of symptoms, you may also receive an intravenous injection of x-ray contrast/dye. Plan on being at [facility] for 30 minutes or longer, depending on the type of exam you are having performed. ? ?This test typically takes 30-45 minutes to complete. ? ?If you have any questions regarding your exam or if you need to reschedule, you may call the CT department at [phone] between the hours of 8:00 am and 5:00 pm, Monday-Friday. ? ?________________________________________________________________________ ? ?Thank you, ? ?Dr. Jackquline Denmark ? ? ? ? ? ? ?We want to thank you for trusting [facility] with your care. All of our  staff and providers value the relationships we have built with our patients, and it is an honor to care for you.  ? ?We are writing to let you know that [facility] will close on Dec 17, 2021, and we invite you to continue to see Dr. Carmell Austria and Gerrit Heck at the [facility] [place] office location. We are consolidating our serices at these [facility] practices to better provide care. Our office staff will work with you to ensure a seamless transition.  ? ?Gerrit Heck, DO -Dr. Bryan Lemma will be movig to [facility] at [address], [place], [place] [address], effective Dec 17, 2021.  Contact [phone] to schedule an appointment with him.  ? ?Carmell Austria, MD- Dr. Lyndel Safe will be movig to [facility] at [address] [place], [place] [address], effective Dec 17, 2021.  Contact [phone] to schedule an appointment with him.  ? ?Requesting Medical Records ?If you need to request your medical records, please follow the instructions below. Your medical records are confidential, and a copy can be transferred to another provider or released to you or another person you designate only with your permission. ? ?There are several ways to request your medical records: ?Requests for medical records can be submitted through our practice.   ?You can also request your records electronically, in your MyChart account by selecting the ?Request Health Records? tab.  ?If you need additional information on how to request records, please go to [email or web address]/, choose Patient Information, then select Request Medical Records. ?To make an appointment or if you have any questions about your health care needs, please contact our office at [phone] and one of our staff members will be glad to assist you. ?[facility] is committed to providing exceptional care for you and our community. Thank you for allowing [place] to serve your health care  needs. ?Sincerely, ? ?Windy Canny, Director [facility] ?[facility] also offers convenient virtual care options. Sore throat? Sinus problems? Cold or flu symptoms? Get care from the comfort of home with [facility] Video Visits and e-Visits. Learn more about the non-emergency conditions treated and start your virtual visit at [email or web address] ? ?

## 2021-10-25 NOTE — Progress Notes (Signed)
? ? ?Chief Complaint:  ? ?Referring Provider:  Nicoletta Dress, MD    ? ? ?ASSESSMENT AND PLAN;  ? ?#1. Epi pain/LLQ pain with tenderness. ? ?#2. IBS-C ? ?#3. FH CRC (son at age 74) ? ?#4. GERD. ? ?Plan: ?-CT AP with contrast ?-CBC, CMP, lipase ?-Continue omeprazole 20 mg p.o. QD ?-Trial of magnesium '400mg'$  po QD ?-Can continue previous laxatives for now. ?-Linzess 72 mcg po QD (sam[ples given) ?-EGD/colon with 2 day prep. ? ? ?HPI:   ? ?Gina Hansen is a 76 y.o. female  ? ?With several GI complaints ? ?C/O episodic Abdo pain- epi pain/LLQ, after eating, assoc N/V, hearburn, then diarrhea, worst episode in Nov 2022, 2-3 times a year.  Seen at ED and had neg US/HIDA. Records awaited.  Told to follow-up with GI.   ? ?She would normally be more constipated x over 20 years.  Has been having BMs 1/day after taking for stool softeners with stimulant laxatives.  She has tried MiraLAX without any benefit.  Does complain of abdominal bloating which gets better with defecation.   ? ?No sodas, chocolates, chewing gums, artificial sweeteners and candy. No NSAIDs ? ?No melena or hematochezia. ? ?Son at age 9 recently diagnosed with colon cancer.  He is getting chemotherapy at [facility]. ? ?Wt Readings from Last 3 Encounters:  ?10/25/21 161 lb 4 oz (73.1 kg)  ?02/23/16 164 lb (74.4 kg)  ? ?FH- son with colon ca at age 63- getting chemo ? 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 ?Past GI procedures ?Colonoscopy ([facility]) 04/2017: Mild sigmoid diverticulosis.  Otherwise normal.  Highly redundant colon. ? ?EGD3/2017 ?-Small HH ?-Irregular Z-line. Bx- c/w reflux.  Neg for EOE/Barrett's. ? ?Negative ultrasound and HIDA scan at [facility] November 2022 ? ?Normal TSH in past. ? ?Past Medical History:  ?Diagnosis Date  ? Allergic rhinitis   ? Chronic constipation   ? Cough   ? Dyslipidemia   ? GERD (gastroesophageal reflux disease)   ? ? ?Past Surgical History:  ?Procedure Laterality Date  ? COLONOSCOPY  08/06/2003  ? COLONOSCOPY   04/10/2017  ? Mild sigmoid diverticulosis. Otherwsise normal colonoscopy. The colon was highly redundant  ? ESOPHAGOGASTRODUODENOSCOPY  11/01/2015  ? Small hiatal hernia. Irregular z-line suggestive gastroesophageal reflux. No active erosion (biopsied) Mild gastritis.  ? KNEE ARTHROSCOPY Left 08/05/2012  ? PARTIAL HYSTERECTOMY Left 08/06/1983  ? ? ?Family History  ?Problem Relation Age of Onset  ? Kidney disease Father   ? Anemia Other   ? Hypertension Mother   ? Heart disease Father   ? Asthma Father   ? Hypercholesterolemia Other   ? ? ?Social History  ? ?Tobacco Use  ? Smoking status: Never  ?Substance Use Topics  ? Alcohol use: Yes  ?  Alcohol/week: 0.0 standard drinks  ?  Comment: occasional  ? Drug use: No  ? ? ?Current Outpatient Medications  ?Medication Sig Dispense Refill  ? cetirizine (ZYRTEC) 10 MG tablet Take 10 mg by mouth daily.    ? Coenzyme Q10 (COQ10) 200 MG CAPS Take 1 capsule by mouth daily.    ? Omeprazole-Sodium Bicarbonate (ZEGERID) 20-1100 MG CAPS capsule Take 1 capsule by mouth daily before breakfast.    ? rosuvastatin (CRESTOR) 5 MG tablet Take 5 mg by mouth daily.    ? ?No current facility-administered medications for this visit.  ? ? ?Allergies  ?Allergen Reactions  ? Levaquin [Levofloxacin In D5w]   ? ? ?Review of Systems:  ?Constitutional: Denies fever, chills,  diaphoresis, appetite change and fatigue.  ?HEENT: Denies photophobia, eye pain, redness, hearing loss, ear pain, congestion, sore throat, rhinorrhea, sneezing, mouth sores, neck pain, neck stiffness and tinnitus.   ?Respiratory: Denies SOB, DOE, cough, chest tightness,  and wheezing.   ?Cardiovascular: Denies chest pain, palpitations and leg swelling.  ?Genitourinary: Denies dysuria, urgency, frequency, hematuria, flank pain and difficulty urinating.  ?Musculoskeletal: Denies myalgias, back pain, joint swelling, arthralgias and gait problem.  ?Skin: No rash.  ?Neurological: Denies dizziness, seizures, syncope, weakness,  light-headedness, numbness and headaches.  ?Hematological: Denies adenopathy. Easy bruising, personal or family bleeding history  ?Psychiatric/Behavioral: No anxiety or depression ? ?  ? ?Physical Exam:   ? ?BP 130/80 (BP Location: Right Arm, Patient Position: Sitting, Cuff Size: Normal)   Pulse 80   Ht '5\' 7"'$  (1.702 m)   Wt 161 lb 4 oz (73.1 kg)   SpO2 96%   BMI 25.26 kg/m?  ?Wt Readings from Last 3 Encounters:  ?10/25/21 161 lb 4 oz (73.1 kg)  ?02/23/16 164 lb (74.4 kg)  ? ?Constitutional:  Well-developed, in no acute distress. ?Psychiatric: Normal mood and affect. Behavior is normal. ?HEENT: Pupils normal.  Conjunctivae are normal. No scleral icterus. ?Cardiovascular: Normal rate, regular rhythm. No edema ?Pulmonary/chest: Effort normal and breath sounds normal. No wheezing, rales or rhonchi. ?Abdominal: Soft, nondistended. LLQ tenderness without rebound.  Bowel sounds active throughout. There are no masses palpable. No hepatomegaly. ?Rectal: Deferred ?Neurological: Alert and oriented to person place and time. ?Skin: Skin is warm and dry. No rashes noted. ? ? ? ?Carmell Austria, MD 10/25/2021, 9:21 AM ? ?Cc: Nicoletta Dress, MD ? ? ?

## 2021-10-29 ENCOUNTER — Telehealth: Payer: Self-pay | Admitting: Gastroenterology

## 2021-10-29 NOTE — Telephone Encounter (Signed)
Patient called, has questions regarding prep instructions. Please advise.  ?

## 2021-10-29 NOTE — Telephone Encounter (Signed)
Questions answered. Miralax is for the procedure only ?

## 2021-10-31 ENCOUNTER — Encounter (HOSPITAL_BASED_OUTPATIENT_CLINIC_OR_DEPARTMENT_OTHER): Payer: Self-pay

## 2021-10-31 ENCOUNTER — Other Ambulatory Visit: Payer: Self-pay

## 2021-10-31 ENCOUNTER — Ambulatory Visit (HOSPITAL_BASED_OUTPATIENT_CLINIC_OR_DEPARTMENT_OTHER)
Admission: RE | Admit: 2021-10-31 | Discharge: 2021-10-31 | Disposition: A | Discharge status: Home / Self Care | Payer: Medicare HMO | Source: Ambulatory Visit | Attending: Gastroenterology | Admitting: Gastroenterology

## 2021-10-31 DIAGNOSIS — K581 Irritable bowel syndrome with constipation: Secondary | ICD-10-CM | POA: Insufficient documentation

## 2021-10-31 DIAGNOSIS — Z800 Family history of malignant neoplasm of digestive organs: Secondary | ICD-10-CM | POA: Diagnosis not present

## 2021-10-31 DIAGNOSIS — R1013 Epigastric pain: Secondary | ICD-10-CM | POA: Diagnosis not present

## 2021-10-31 DIAGNOSIS — R109 Unspecified abdominal pain: Secondary | ICD-10-CM | POA: Diagnosis not present

## 2021-10-31 MED ORDER — IOHEXOL 300 MG/ML  SOLN
100.0000 mL | Freq: Once | INTRAMUSCULAR | Status: AC | PRN
  Administered 2021-10-31: 100 mL via INTRAVENOUS

## 2021-11-08 DIAGNOSIS — J40 Bronchitis, not specified as acute or chronic: Secondary | ICD-10-CM | POA: Diagnosis not present

## 2021-11-08 DIAGNOSIS — J040 Acute laryngitis: Secondary | ICD-10-CM | POA: Diagnosis not present

## 2021-11-22 DIAGNOSIS — M810 Age-related osteoporosis without current pathological fracture: Secondary | ICD-10-CM | POA: Diagnosis not present

## 2021-11-22 DIAGNOSIS — R1084 Generalized abdominal pain: Secondary | ICD-10-CM | POA: Diagnosis not present

## 2021-11-22 DIAGNOSIS — K219 Gastro-esophageal reflux disease without esophagitis: Secondary | ICD-10-CM | POA: Diagnosis not present

## 2021-11-22 DIAGNOSIS — R7309 Other abnormal glucose: Secondary | ICD-10-CM | POA: Diagnosis not present

## 2021-11-22 DIAGNOSIS — R030 Elevated blood-pressure reading, without diagnosis of hypertension: Secondary | ICD-10-CM | POA: Diagnosis not present

## 2021-11-22 DIAGNOSIS — J4520 Mild intermittent asthma, uncomplicated: Secondary | ICD-10-CM | POA: Diagnosis not present

## 2021-11-22 DIAGNOSIS — E785 Hyperlipidemia, unspecified: Secondary | ICD-10-CM | POA: Diagnosis not present

## 2021-12-19 ENCOUNTER — Encounter: Payer: Self-pay | Admitting: Gastroenterology

## 2021-12-21 ENCOUNTER — Encounter: Payer: Self-pay | Admitting: Certified Registered Nurse Anesthetist

## 2021-12-26 ENCOUNTER — Ambulatory Visit (AMBULATORY_SURGERY_CENTER): Payer: Medicare HMO | Admitting: Gastroenterology

## 2021-12-26 ENCOUNTER — Encounter: Payer: Self-pay | Admitting: Gastroenterology

## 2021-12-26 VITALS — BP 120/72 | HR 74 | Temp 96.8°F | Resp 16 | Ht 67.0 in | Wt 161.0 lb

## 2021-12-26 DIAGNOSIS — K5730 Diverticulosis of large intestine without perforation or abscess without bleeding: Secondary | ICD-10-CM

## 2021-12-26 DIAGNOSIS — K319 Disease of stomach and duodenum, unspecified: Secondary | ICD-10-CM | POA: Diagnosis not present

## 2021-12-26 DIAGNOSIS — R1013 Epigastric pain: Secondary | ICD-10-CM

## 2021-12-26 DIAGNOSIS — Z800 Family history of malignant neoplasm of digestive organs: Secondary | ICD-10-CM | POA: Diagnosis not present

## 2021-12-26 DIAGNOSIS — K640 First degree hemorrhoids: Secondary | ICD-10-CM | POA: Diagnosis not present

## 2021-12-26 DIAGNOSIS — K2970 Gastritis, unspecified, without bleeding: Secondary | ICD-10-CM

## 2021-12-26 DIAGNOSIS — K449 Diaphragmatic hernia without obstruction or gangrene: Secondary | ICD-10-CM | POA: Diagnosis not present

## 2021-12-26 DIAGNOSIS — K581 Irritable bowel syndrome with constipation: Secondary | ICD-10-CM | POA: Diagnosis not present

## 2021-12-26 DIAGNOSIS — K2950 Unspecified chronic gastritis without bleeding: Secondary | ICD-10-CM | POA: Diagnosis not present

## 2021-12-26 MED ORDER — MAGNESIUM GLYCINATE 100 MG PO CAPS: 200.0000 mg | ORAL_CAPSULE | Freq: Every day | ORAL | 3 refills | Status: AC

## 2021-12-26 MED ORDER — POLYETHYLENE GLYCOL 3350 17 G PO PACK: 17.0000 g | PACK | Freq: Every day | ORAL | Status: AC

## 2021-12-26 MED ORDER — SODIUM CHLORIDE 0.9 % IV SOLN: 500.0000 mL | Freq: Once | INTRAVENOUS | Status: DC

## 2021-12-26 NOTE — Op Note (Signed)
[place] Patient Name: Gina Hansen Procedure Date: 12/26/2021 11:14 AM MRN: [id number] Endoscopist: Jackquline Denmark , MD Age: 76 Referring MD:  Date of Birth: May 01, 1946 Gender: Female Account #: [id number] Procedure:                Upper GI endoscopy Indications:              Epigastric abdominal pain with neg CT AP 10/2021 Medicines:                Monitored Anesthesia Care Procedure:                Pre-Anesthesia Assessment:                           - Prior to the procedure, a History and Physical                            was performed, and patient medications and                            allergies were reviewed. The patient's tolerance of                            previous anesthesia was also reviewed. The risks                            and benefits of the procedure and the sedation                            options and risks were discussed with the patient.                            All questions were answered, and informed consent                            was obtained. Prior Anticoagulants: The patient has                            taken no previous anticoagulant or antiplatelet                            agents. ASA Grade Assessment: II - A patient with                            mild systemic disease. After reviewing the risks                            and benefits, the patient was deemed in                            satisfactory condition to undergo the procedure.                           After obtaining informed consent, the endoscope was  passed under direct vision. Throughout the                            procedure, the patient's blood pressure, pulse, and                            oxygen saturations were monitored continuously. The                            Olympus Endoscope [id number] was introduced through                            the mouth, and advanced to the second part of                            duodenum.  The upper GI endoscopy was accomplished                            without difficulty. The patient tolerated the                            procedure well. Scope In: Scope Out: Findings:                 The examined esophagus was normal with well-defined                            Z-line at 36 cm.                           A small hiatal hernia was present.                           The entire examined stomach was normal. Biopsies                            were taken with a cold forceps for histology.                           The examined duodenum was normal. Biopsies for                            histology were taken with a cold forceps for                            evaluation of celiac disease. Complications:            No immediate complications. Estimated Blood Loss:     Estimated blood loss: none. Impression:               - Small hiatal hernia.                           - Otherwise normal EGD. Recommendation:           - Patient has a contact number available for  emergencies. The signs and symptoms of potential                            delayed complications were discussed with the                            patient. Return to normal activities tomorrow.                            Written discharge instructions were provided to the                            patient.                           - Resume previous diet.                           - Continue present medications.                           - Await pathology results.                           - The findings and recommendations were discussed                            with the patient's family. Jackquline Denmark, MD 12/26/2021 11:41:21 AM This report has been signed electronically.

## 2021-12-26 NOTE — Progress Notes (Signed)
Sedate, gd SR, tolerated procedure well, VSS, report to RN 

## 2021-12-26 NOTE — Progress Notes (Signed)
Chief Complaint:   Referring Provider:  Nicoletta Dress, MD      ASSESSMENT AND PLAN;   #1. Epi pain/LLQ pain with tenderness.  #2. IBS-C  #3. FH CRC (son at age 74)  #16. GERD.  Plan: -CT AP with contrast 10/31/2021- neg -Continue omeprazole 20 mg p.o. QD -Trial of magnesium '400mg'$  po QD -Can continue previous laxatives for now. -Linzess 72 mcg po QD (sam[ples given) -EGD/colon with 2 day prep.   HPI:    Gina Hansen is a 76 y.o. female   With several GI complaints  C/O episodic Abdo pain- epi pain/LLQ, after eating, assoc N/V, hearburn, then diarrhea, worst episode in Nov 2022, 2-3 times a year.  Seen at ED and had neg US/HIDA. Records awaited.  Told to follow-up with GI.    She would normally be more constipated x over 20 years.  Has been having BMs 1/day after taking for stool softeners with stimulant laxatives.  She has tried MiraLAX without any benefit.  Does complain of abdominal bloating which gets better with defecation.    No sodas, chocolates, chewing gums, artificial sweeteners and candy. No NSAIDs  No melena or hematochezia.  Son at age 26 recently diagnosed with colon cancer.  He is getting chemotherapy at [facility].  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  Otherwise normal.  Highly redundant colon.  EGD3/2017 -Small HH -Irregular Z-line. Bx- c/w reflux.  Neg for EOE/Barrett's.  Negative ultrasound and HIDA scan at [facility] November 2022  Normal TSH in past.  Past Medical History:  Diagnosis Date   Allergic rhinitis    Chronic constipation    Cough    Dyslipidemia    GERD (gastroesophageal reflux disease)     Past Surgical History:  Procedure Laterality Date   COLONOSCOPY   08/06/2003   COLONOSCOPY  04/10/2017   Mild sigmoid diverticulosis. Otherwsise normal colonoscopy. The colon was highly redundant   ESOPHAGOGASTRODUODENOSCOPY  11/01/2015   Small hiatal hernia. Irregular z-line suggestive gastroesophageal reflux. No active erosion (biopsied) Mild gastritis.   KNEE ARTHROSCOPY Left 08/05/2012   PARTIAL HYSTERECTOMY Left 08/06/1983    Family History  Problem Relation Age of Onset   Kidney disease Father    Anemia Other    Hypertension Mother    Heart disease Father    Asthma Father    Hypercholesterolemia Other     Social History   Tobacco Use   Smoking status: Never  Substance Use Topics   Alcohol use: Yes    Alcohol/week: 0.0 standard drinks    Comment: occasional   Drug use: No    Current Outpatient Medications  Medication Sig Dispense Refill   cetirizine (ZYRTEC) 10 MG tablet Take 10 mg by mouth daily.     Coenzyme Q10 (COQ10) 200 MG CAPS Take 1 capsule by mouth daily.     Omeprazole-Sodium Bicarbonate (ZEGERID) 20-1100 MG CAPS capsule Take 1 capsule by mouth daily before breakfast.     rosuvastatin (CRESTOR) 5 MG tablet Take 5 mg by mouth daily.     linaclotide (LINZESS) 72 MCG capsule Take 1 capsule (72 mcg total) by mouth daily before breakfast. 30 capsule 11   Current Facility-Administered Medications  Medication Dose  Route Frequency Provider Last Rate Last Admin   0.9 %  sodium chloride infusion  500 mL Intravenous Once Jackquline Denmark, MD        Allergies  Allergen Reactions   Levaquin [Levofloxacin In D5w]     Review of Systems:  Constitutional: Denies fever, chills, diaphoresis, appetite change and fatigue.  HEENT: Denies photophobia, eye pain, redness, hearing loss, ear pain, congestion, sore throat, rhinorrhea, sneezing, mouth sores, neck pain, neck stiffness and tinnitus.   Respiratory: Denies SOB, DOE, cough, chest tightness,  and wheezing.   Cardiovascular: Denies chest pain, palpitations and leg swelling.   Genitourinary: Denies dysuria, urgency, frequency, hematuria, flank pain and difficulty urinating.  Musculoskeletal: Denies myalgias, back pain, joint swelling, arthralgias and gait problem.  Skin: No rash.  Neurological: Denies dizziness, seizures, syncope, weakness, light-headedness, numbness and headaches.  Hematological: Denies adenopathy. Easy bruising, personal or family bleeding history  Psychiatric/Behavioral: No anxiety or depression     Physical Exam:    BP (!) 142/76   Pulse 77   Temp (!) 96.8 F (36 C)   Ht '5\' 7"'$  (1.702 m)   Wt 161 lb (73 kg)   SpO2 98%   BMI 25.22 kg/m  Wt Readings from Last 3 Encounters:  12/26/21 161 lb (73 kg)  10/25/21 161 lb 4 oz (73.1 kg)  02/23/16 164 lb (74.4 kg)   Constitutional:  Well-developed, in no acute distress. Psychiatric: Normal mood and affect. Behavior is normal. HEENT: Pupils normal.  Conjunctivae are normal. No scleral icterus. Cardiovascular: Normal rate, regular rhythm. No edema Pulmonary/chest: Effort normal and breath sounds normal. No wheezing, rales or rhonchi. Abdominal: Soft, nondistended. LLQ tenderness without rebound.  Bowel sounds active throughout. There are no masses palpable. No hepatomegaly. Rectal: Deferred Neurological: Alert and oriented to person place and time. Skin: Skin is warm and dry. No rashes noted.    Carmell Austria, MD 12/26/2021, 11:06 AM  Cc: Nicoletta Dress, MD

## 2021-12-26 NOTE — Progress Notes (Signed)
Pt's states no medical or surgical changes since previsit or office visit. 

## 2021-12-26 NOTE — Progress Notes (Signed)
Called to room to assist during endoscopic procedure.  Patient ID and intended procedure confirmed with present staff. Received instructions for my participation in the procedure from the performing physician.  

## 2021-12-26 NOTE — Patient Instructions (Addendum)
Handout on gastritis and diverticulosis.  Resume previous diet and continue present medications. Pick up prescriptions sent in for you today. No repeat colonoscopy unless needed!   HAD AN ENDOSCOPIC PROCEDURE TODAY AT [facility]:   Refer to the procedure report that was given to you for any specific questions about what was found during the examination.  If the procedure report does not answer your questions, please call your gastroenterologist to clarify.  If you requested that your care partner not be given the details of your procedure findings, then the procedure report has been included in a sealed envelope for you to review at your convenience later.  YOU SHOULD EXPECT: Some feelings of bloating in the abdomen. Passage of more gas than usual.  Walking can help get rid of the air that was put into your GI tract during the procedure and reduce the bloating. If you had a lower endoscopy (such as a colonoscopy or flexible sigmoidoscopy) you may notice spotting of blood in your stool or on the toilet paper. If you underwent a bowel prep for your procedure, you may not have a normal bowel movement for a few days.  Please Note:  You might notice some irritation and congestion in your nose or some drainage.  This is from the oxygen used during your procedure.  There is no need for concern and it should clear up in a day or so.  SYMPTOMS TO REPORT IMMEDIATELY:  Following lower endoscopy (colonoscopy or flexible sigmoidoscopy):  Excessive amounts of blood in the stool  Significant tenderness or worsening of abdominal pains  Swelling of the abdomen that is new, acute  Fever of 100F or higher  Following upper endoscopy (EGD)  Vomiting of blood or coffee ground material  New chest pain or pain under the shoulder blades  Painful or persistently difficult swallowing  New shortness of breath  Fever of 100F or higher  Black, tarry-looking stools  For urgent or emergent issues, a  gastroenterologist can be reached at any hour by calling [phone]. Do not use MyChart messaging for urgent concerns.    DIET:  We do recommend a small meal at first, but then you may proceed to your regular diet.  Drink plenty of fluids but you should avoid alcoholic beverages for 24 hours.  ACTIVITY:  You should plan to take it easy for the rest of today and you should NOT DRIVE or use heavy machinery until tomorrow (because of the sedation medicines used during the test).    FOLLOW UP: Our staff will call the number listed on your records 48-72 hours following your procedure to check on you and address any questions or concerns that you may have regarding the information given to you following your procedure. If we do not reach you, we will leave a message.  We will attempt to reach you two times.  During this call, we will ask if you have developed any symptoms of COVID 19. If you develop any symptoms (ie: fever, flu-like symptoms, shortness of breath, cough etc.) before then, please call [phone].  If you test positive for Covid 19 in the 2 weeks post procedure, please call and report this information to [place].    If any biopsies were taken you will be contacted by phone or by letter within the next 1-3 weeks.  Please call us at [phone] if you have not heard about the biopsies in 3 weeks.    SIGNATURES/CONFIDENTIALITY: You and/or your care partner  have signed paperwork which will be entered into your electronic medical record.  These signatures attest to the fact that that the information above on your After Visit Summary has been reviewed and is understood.  Full responsibility of the confidentiality of this discharge information lies with you and/or your care-partner.

## 2021-12-26 NOTE — Op Note (Signed)
[place] Patient Name: Gina Hansen Procedure Date: 12/26/2021 11:10 AM MRN: [id number] Endoscopist: Jackquline Denmark , MD Age: 76 Referring MD:  Date of Birth: [birth year] Gender: Female Account #: [id number] Procedure:                Colonoscopy Indications:              Screening in patient at increased risk: FH CRC (son                            at age 41), IBS-C. Medicines:                Monitored Anesthesia Care Procedure:                Pre-Anesthesia Assessment:                           - Prior to the procedure, a History and Physical                            was performed, and patient medications and                            allergies were reviewed. The patient's tolerance of                            previous anesthesia was also reviewed. The risks                            and benefits of the procedure and the sedation                            options and risks were discussed with the patient.                            All questions were answered, and informed consent                            was obtained. Prior Anticoagulants: The patient has                            taken no previous anticoagulant or antiplatelet                            agents. ASA Grade Assessment: II - A patient with                            mild systemic disease. After reviewing the risks                            and benefits, the patient was deemed in                            satisfactory condition to undergo the procedure.  After obtaining informed consent, the colonoscope                            was passed under direct vision. Throughout the                            procedure, the patient's blood pressure, pulse, and                            oxygen saturations were monitored continuously. The                            Olympus #[id number] was introduced through the anus                            and advanced to the the cecum, identified  by                            appendiceal orifice and ileocecal valve.                            Thereafter, 2 cm of TI was intubated. The                            colonoscopy was performed without difficulty. The                            patient tolerated the procedure well. The quality                            of the bowel preparation was good. The ileocecal                            valve, appendiceal orifice, TI, and rectum were                            photographed. Scope In: 11:25:31 AM Scope Out: 11:38:28 AM Scope Withdrawal Time: 0 hours 9 minutes 31 seconds  Total Procedure Duration: 0 hours 12 minutes 57 seconds  Findings:                 Multiple medium-mouthed diverticula were found in                            the sigmoid colon.                           A diffuse area of mild melanosis was found in the                            entire colon.                           Non-bleeding internal hemorrhoids were found during  retroflexion. The hemorrhoids were small and Grade                            I (internal hemorrhoids that do not prolapse).                           The exam was otherwise without abnormality on                            direct and retroflexion views. Terminal ileum was                            normal. The colon was highly tortuous. Complications:            No immediate complications. Estimated Blood Loss:     Estimated blood loss: none. Impression:               - Moderate sigmoid diverticulosis.                           - Mild melanosis coli                           - Otherwise normal colonoscopy to TI.                           - No specimens collected. Recommendation:           - Patient has a contact number available for                            emergencies. The signs and symptoms of potential                            delayed complications were discussed with the                            patient.  Return to normal activities tomorrow.                            Written discharge instructions were provided to the                            patient.                           - Resume previous diet.                           - Continue present medications.                           - Repeat colonoscopy is not recommended for                            screening purposes.                           -  Return to GI clinic PRN.                           - The findings and recommendations were discussed                            with the patient's family. Jackquline Denmark, MD 12/26/2021 11:46:37 AM This report has been signed electronically.

## 2021-12-27 ENCOUNTER — Telehealth: Payer: Self-pay

## 2021-12-27 NOTE — Telephone Encounter (Signed)
  Follow up Call-     12/26/2021   10:37 AM  Call back number  Post procedure Call Back phone  # [phone]  Permission to leave phone message Yes     Patient questions:  Do you have a fever, pain , or abdominal swelling? No. Pain Score  0 *  Have you tolerated food without any problems? Yes.    Have you been able to return to your normal activities? Yes.    Do you have any questions about your discharge instructions: Diet   No. Medications  No. Follow up visit  No.  Do you have questions or concerns about your Care? No.  Actions: * If pain score is 4 or above: No action needed, pain <4.

## 2021-12-28 ENCOUNTER — Encounter: Payer: Self-pay | Admitting: Gastroenterology

## 2022-04-26 DIAGNOSIS — Z1331 Encounter for screening for depression: Secondary | ICD-10-CM | POA: Diagnosis not present

## 2022-04-26 DIAGNOSIS — E785 Hyperlipidemia, unspecified: Secondary | ICD-10-CM | POA: Diagnosis not present

## 2022-04-26 DIAGNOSIS — Z9181 History of falling: Secondary | ICD-10-CM | POA: Diagnosis not present

## 2022-04-26 DIAGNOSIS — Z0000 Encounter for general adult medical examination without abnormal findings: Secondary | ICD-10-CM | POA: Diagnosis not present

## 2022-05-08 DIAGNOSIS — Z1231 Encounter for screening mammogram for malignant neoplasm of breast: Secondary | ICD-10-CM | POA: Diagnosis not present

## 2022-05-23 DIAGNOSIS — M810 Age-related osteoporosis without current pathological fracture: Secondary | ICD-10-CM | POA: Diagnosis not present

## 2022-05-23 DIAGNOSIS — N959 Unspecified menopausal and perimenopausal disorder: Secondary | ICD-10-CM | POA: Diagnosis not present

## 2022-05-24 DIAGNOSIS — J4520 Mild intermittent asthma, uncomplicated: Secondary | ICD-10-CM | POA: Diagnosis not present

## 2022-05-24 DIAGNOSIS — R030 Elevated blood-pressure reading, without diagnosis of hypertension: Secondary | ICD-10-CM | POA: Diagnosis not present

## 2022-05-24 DIAGNOSIS — E785 Hyperlipidemia, unspecified: Secondary | ICD-10-CM | POA: Diagnosis not present

## 2022-05-24 DIAGNOSIS — M810 Age-related osteoporosis without current pathological fracture: Secondary | ICD-10-CM | POA: Diagnosis not present

## 2022-05-24 DIAGNOSIS — K219 Gastro-esophageal reflux disease without esophagitis: Secondary | ICD-10-CM | POA: Diagnosis not present

## 2022-05-24 DIAGNOSIS — I700 Atherosclerosis of aorta: Secondary | ICD-10-CM | POA: Diagnosis not present

## 2022-05-24 DIAGNOSIS — R7309 Other abnormal glucose: Secondary | ICD-10-CM | POA: Diagnosis not present

## 2022-05-24 DIAGNOSIS — Z23 Encounter for immunization: Secondary | ICD-10-CM | POA: Diagnosis not present

## 2022-07-16 DIAGNOSIS — J209 Acute bronchitis, unspecified: Secondary | ICD-10-CM | POA: Diagnosis not present

## 2022-07-16 DIAGNOSIS — J019 Acute sinusitis, unspecified: Secondary | ICD-10-CM | POA: Diagnosis not present

## 2022-07-16 DIAGNOSIS — J4520 Mild intermittent asthma, uncomplicated: Secondary | ICD-10-CM | POA: Diagnosis not present

## 2022-08-22 DIAGNOSIS — D225 Melanocytic nevi of trunk: Secondary | ICD-10-CM | POA: Diagnosis not present

## 2022-08-22 DIAGNOSIS — L820 Inflamed seborrheic keratosis: Secondary | ICD-10-CM | POA: Diagnosis not present

## 2022-08-22 DIAGNOSIS — L578 Other skin changes due to chronic exposure to nonionizing radiation: Secondary | ICD-10-CM | POA: Diagnosis not present

## 2022-08-22 DIAGNOSIS — L814 Other melanin hyperpigmentation: Secondary | ICD-10-CM | POA: Diagnosis not present

## 2022-11-25 DIAGNOSIS — R030 Elevated blood-pressure reading, without diagnosis of hypertension: Secondary | ICD-10-CM | POA: Diagnosis not present

## 2022-11-25 DIAGNOSIS — E785 Hyperlipidemia, unspecified: Secondary | ICD-10-CM | POA: Diagnosis not present

## 2022-11-25 DIAGNOSIS — J4520 Mild intermittent asthma, uncomplicated: Secondary | ICD-10-CM | POA: Diagnosis not present

## 2022-11-25 DIAGNOSIS — I700 Atherosclerosis of aorta: Secondary | ICD-10-CM | POA: Diagnosis not present

## 2022-11-25 DIAGNOSIS — M810 Age-related osteoporosis without current pathological fracture: Secondary | ICD-10-CM | POA: Diagnosis not present

## 2023-05-22 DIAGNOSIS — Z1231 Encounter for screening mammogram for malignant neoplasm of breast: Secondary | ICD-10-CM | POA: Diagnosis not present

## 2023-05-27 DIAGNOSIS — R030 Elevated blood-pressure reading, without diagnosis of hypertension: Secondary | ICD-10-CM | POA: Diagnosis not present

## 2023-05-27 DIAGNOSIS — Z139 Encounter for screening, unspecified: Secondary | ICD-10-CM | POA: Diagnosis not present

## 2023-05-27 DIAGNOSIS — E785 Hyperlipidemia, unspecified: Secondary | ICD-10-CM | POA: Diagnosis not present

## 2023-05-27 DIAGNOSIS — J4520 Mild intermittent asthma, uncomplicated: Secondary | ICD-10-CM | POA: Diagnosis not present

## 2023-05-27 DIAGNOSIS — Z6824 Body mass index (BMI) 24.0-24.9, adult: Secondary | ICD-10-CM | POA: Diagnosis not present

## 2023-05-27 DIAGNOSIS — M8589 Other specified disorders of bone density and structure, multiple sites: Secondary | ICD-10-CM | POA: Diagnosis not present

## 2023-05-27 DIAGNOSIS — M810 Age-related osteoporosis without current pathological fracture: Secondary | ICD-10-CM | POA: Diagnosis not present

## 2023-05-27 DIAGNOSIS — Z23 Encounter for immunization: Secondary | ICD-10-CM | POA: Diagnosis not present

## 2023-07-08 DIAGNOSIS — L729 Follicular cyst of the skin and subcutaneous tissue, unspecified: Secondary | ICD-10-CM | POA: Diagnosis not present

## 2023-07-08 DIAGNOSIS — B070 Plantar wart: Secondary | ICD-10-CM | POA: Diagnosis not present

## 2023-07-18 DIAGNOSIS — L723 Sebaceous cyst: Secondary | ICD-10-CM | POA: Diagnosis not present

## 2023-07-24 DIAGNOSIS — M8589 Other specified disorders of bone density and structure, multiple sites: Secondary | ICD-10-CM | POA: Diagnosis not present

## 2023-07-27 DIAGNOSIS — M810 Age-related osteoporosis without current pathological fracture: Secondary | ICD-10-CM | POA: Diagnosis not present

## 2023-07-27 DIAGNOSIS — Z13820 Encounter for screening for osteoporosis: Secondary | ICD-10-CM | POA: Diagnosis not present

## 2023-09-24 DIAGNOSIS — L814 Other melanin hyperpigmentation: Secondary | ICD-10-CM | POA: Diagnosis not present

## 2023-09-24 DIAGNOSIS — L820 Inflamed seborrheic keratosis: Secondary | ICD-10-CM | POA: Diagnosis not present

## 2023-09-24 DIAGNOSIS — L578 Other skin changes due to chronic exposure to nonionizing radiation: Secondary | ICD-10-CM | POA: Diagnosis not present

## 2023-09-24 DIAGNOSIS — D225 Melanocytic nevi of trunk: Secondary | ICD-10-CM | POA: Diagnosis not present

## 2023-09-24 DIAGNOSIS — L570 Actinic keratosis: Secondary | ICD-10-CM | POA: Diagnosis not present

## 2023-10-23 DIAGNOSIS — Z6825 Body mass index (BMI) 25.0-25.9, adult: Secondary | ICD-10-CM | POA: Diagnosis not present

## 2023-10-23 DIAGNOSIS — J309 Allergic rhinitis, unspecified: Secondary | ICD-10-CM | POA: Diagnosis not present

## 2023-10-23 DIAGNOSIS — B9689 Other specified bacterial agents as the cause of diseases classified elsewhere: Secondary | ICD-10-CM | POA: Diagnosis not present

## 2023-10-23 DIAGNOSIS — J019 Acute sinusitis, unspecified: Secondary | ICD-10-CM | POA: Diagnosis not present

## 2023-11-14 DIAGNOSIS — R300 Dysuria: Secondary | ICD-10-CM | POA: Diagnosis not present

## 2023-11-14 DIAGNOSIS — N390 Urinary tract infection, site not specified: Secondary | ICD-10-CM | POA: Diagnosis not present

## 2023-11-14 DIAGNOSIS — R8290 Unspecified abnormal findings in urine: Secondary | ICD-10-CM | POA: Diagnosis not present

## 2023-12-12 DIAGNOSIS — I700 Atherosclerosis of aorta: Secondary | ICD-10-CM | POA: Diagnosis not present

## 2023-12-12 DIAGNOSIS — E785 Hyperlipidemia, unspecified: Secondary | ICD-10-CM | POA: Diagnosis not present

## 2023-12-12 DIAGNOSIS — J4520 Mild intermittent asthma, uncomplicated: Secondary | ICD-10-CM | POA: Diagnosis not present

## 2023-12-12 DIAGNOSIS — M810 Age-related osteoporosis without current pathological fracture: Secondary | ICD-10-CM | POA: Diagnosis not present

## 2023-12-12 DIAGNOSIS — Z6824 Body mass index (BMI) 24.0-24.9, adult: Secondary | ICD-10-CM | POA: Diagnosis not present

## 2023-12-12 DIAGNOSIS — R030 Elevated blood-pressure reading, without diagnosis of hypertension: Secondary | ICD-10-CM | POA: Diagnosis not present

## 2024-01-01 ENCOUNTER — Other Ambulatory Visit: Payer: Self-pay

## 2024-01-01 DIAGNOSIS — R059 Cough, unspecified: Secondary | ICD-10-CM | POA: Insufficient documentation

## 2024-01-01 DIAGNOSIS — K219 Gastro-esophageal reflux disease without esophagitis: Secondary | ICD-10-CM | POA: Insufficient documentation

## 2024-01-01 DIAGNOSIS — J309 Allergic rhinitis, unspecified: Secondary | ICD-10-CM | POA: Insufficient documentation

## 2024-01-01 DIAGNOSIS — E785 Hyperlipidemia, unspecified: Secondary | ICD-10-CM | POA: Insufficient documentation

## 2024-01-01 DIAGNOSIS — K5909 Other constipation: Secondary | ICD-10-CM | POA: Insufficient documentation

## 2024-01-02 ENCOUNTER — Encounter: Payer: Self-pay | Admitting: Cardiology

## 2024-01-02 ENCOUNTER — Ambulatory Visit: Attending: Cardiology | Admitting: Cardiology

## 2024-01-02 VITALS — BP 134/88 | HR 87 | Ht 67.0 in | Wt 157.8 lb

## 2024-01-02 DIAGNOSIS — E785 Hyperlipidemia, unspecified: Secondary | ICD-10-CM

## 2024-01-02 DIAGNOSIS — I2510 Atherosclerotic heart disease of native coronary artery without angina pectoris: Secondary | ICD-10-CM | POA: Insufficient documentation

## 2024-01-02 DIAGNOSIS — R0609 Other forms of dyspnea: Secondary | ICD-10-CM

## 2024-01-02 HISTORY — DX: Atherosclerotic heart disease of native coronary artery without angina pectoris: I25.10

## 2024-01-02 HISTORY — DX: Other forms of dyspnea: R06.09

## 2024-01-02 MED ORDER — ROSUVASTATIN CALCIUM 10 MG PO TABS: 10.0000 mg | ORAL_TABLET | Freq: Every day | ORAL | 3 refills | Status: DC

## 2024-01-02 NOTE — Patient Instructions (Addendum)
 Medication Instructions:   INCREASE: Crestor  10mg  1 tablet daily   Lab Work: Your physician recommends that you return for lab work in: 6 weeks You need to have labs done when you are fasting.  You can come Monday through Friday 8:30 am to 12:00 pm and 1:15 to 4:30. You do not need to make an appointment as the order has already been placed. The labs you are going to have done are AST, ALT Lipids. .   Testing/Procedures:  We will order CT coronary calcium score. It will cost $99.00 and is not covered by insurance.  Please call to schedule.    [facility]  [address] [place], [place] [address] [phone]  Your physician has requested that you have an echocardiogram. Echocardiography is a painless test that uses sound waves to create images of your heart. It provides your doctor with information about the size and shape of your heart and how well your heart's chambers and valves are working. This procedure takes approximately one hour. There are no restrictions for this procedure. Please do NOT wear cologne, perfume, aftershave, or lotions (deodorant is allowed). Please arrive 15 minutes prior to your appointment time.  Please note: We ask at that you not bring children with you during ultrasound (echo/ vascular) testing. Due to room size and safety concerns, children are not allowed in the ultrasound rooms during exams. Our front office staff cannot provide observation of children in our lobby area while testing is being conducted. An adult accompanying a patient to their appointment will only be allowed in the ultrasound room at the discretion of the ultrasound technician under special circumstances. We apologize for any inconvenience.    Follow-Up: At [facility], you and your health needs are our priority.  As part of our continuing mission to provide you with exceptional heart care, we have created designated Provider Care Teams.  These Care Teams include your primary  Cardiologist (physician) and Advanced Practice Providers (APPs -  Physician Assistants and Nurse Practitioners) who all work together to provide you with the care you need, when you need it.  We recommend signing up for the patient portal called "MyChart".  Sign up information is provided on this After Visit Summary.  MyChart is used to connect with patients for Virtual Visits (Telemedicine).  Patients are able to view lab/test results, encounter notes, upcoming appointments, etc.  Non-urgent messages can be sent to your provider as well.   To learn more about what you can do with MyChart, go to [email or web address].    Your next appointment:   3 month(s)  The format for your next appointment:   In Person  Provider:   Ralene Burger, MD    Other Instructions NA

## 2024-01-02 NOTE — Progress Notes (Signed)
 Cardiology Consultation:    Date:  01/02/2024   ID:  SEAN MACWILLIAMS, DOB 03-17-1946, MRN [id number]  PCP:  Adrian Hopper, MD  Cardiologist:  Ralene Burger, MD   Referring MD: Adrian Hopper, MD   No chief complaint on file.   History of Present Illness:    Gina Hansen is a 78 y.o. female who is being seen today for the evaluation of to be reestablished at the request of Adrian Hopper, MD. past medical history significant for dyslipidemia, GERD, I met her in 2017 when she was referred to me for evaluation after CT of her chest only showed calcification of the LAD.  Since that time she disappeared from follow-up.  She comes today to be established as again as a patient.  Overall she is doing well.  She denies having any chest pain tightness squeezing pressure burning chest.  She exercised on the regular basis, she does yoga different kind of exercises done [employer] including some water aerobics and doing very well.  No chest pain tightness squeezing pressure burning chest.  No swelling of lower extremities no palpitation no dizziness abdomen she looks good and she is doing very well.  She does not smoke never did does have family history of coronary disease but not premature.  Past Medical History:  Diagnosis Date   Allergic rhinitis    Chronic constipation    Chronic cough 02/23/2016   Cough    Dyslipidemia    GERD (gastroesophageal reflux disease)    Pulmonary infiltrate present on computed tomography 02/23/2016    Past Surgical History:  Procedure Laterality Date   COLONOSCOPY  08/06/2003   COLONOSCOPY  04/10/2017   Mild sigmoid diverticulosis. Otherwsise normal colonoscopy. The colon was highly redundant   ESOPHAGOGASTRODUODENOSCOPY  11/01/2015   Small hiatal hernia. Irregular z-line suggestive gastroesophageal reflux. No active erosion (biopsied) Mild gastritis.   KNEE ARTHROSCOPY Left 08/05/2012   PARTIAL HYSTERECTOMY Left 08/06/1983   UPPER  GASTROINTESTINAL ENDOSCOPY      Current Medications: Current Meds  Medication Sig   cetirizine (ZYRTEC) 10 MG tablet Take 10 mg by mouth daily.   Cholecalciferol (VITAMIN D) 50 MCG (2000 UT) CAPS Take 2,000 Units by mouth daily.   Coenzyme Q10 (COQ10) 200 MG CAPS Take 1 capsule by mouth daily.   Magnesium  Glycinate 100 MG CAPS Take 200 mg by mouth daily.   rosuvastatin (CRESTOR) 10 MG tablet Take 10 mg by mouth daily.   Current Facility-Administered Medications for the 01/02/24 encounter (Office Visit) with Kippy Gohman J, MD  Medication   polyethylene glycol (MIRALAX  / GLYCOLAX ) packet 17 g     Allergies:   Levaquin [levofloxacin in d5w]   Social History   Socioeconomic History   Marital status: Married    Spouse name: Not on file   Number of children: 3   Years of education: Not on file   Highest education level: Not on file  Occupational History   Occupation: Part Time - Hair Dresser  Tobacco Use   Smoking status: Never   Smokeless tobacco: Not on file  Vaping Use   Vaping status: Never Used  Substance and Sexual Activity   Alcohol use: Yes    Alcohol/week: 0.0 standard drinks of alcohol    Comment: occasional   Drug use: No   Sexual activity: Not on file  Other Topics Concern   Not on file  Social History Narrative   Not on file   Social Drivers of Health  Financial Resource Strain: Not on file  Food Insecurity: Not on file  Transportation Needs: Not on file  Physical Activity: Not on file  Stress: Not on file  Social Connections: Not on file     Family History: The patient's family history includes Anemia in an other family member; Asthma in her father; Colon cancer in her child; Heart disease in her father; Hypercholesterolemia in an other family member; Hypertension in her mother; Kidney disease in her father. ROS:   Please see the history of present illness.    All 14 point review of systems negative except as described per history of present  illness.  EKGs/Labs/Other Studies Reviewed:    The following studies were reviewed today:   EKG:  EKG Interpretation Date/Time:  Friday Jan 02 2024 13:28:09 EDT Ventricular Rate:  87 PR Interval:  160 QRS Duration:  100 QT Interval:  360 QTC Calculation: 433 R Axis:   -47  Text Interpretation: Normal sinus rhythm Left anterior fascicular block Cannot rule out Anterior infarct , age undetermined Abnormal ECG No previous ECGs available Confirmed by Ralene Burger [phone]) on 01/02/2024 1:48:51 PM    Recent Labs: No results found for requested labs within last 365 days.  Recent Lipid Panel No results found for: "CHOL", "TRIG", "HDL", "CHOLHDL", "VLDL", "LDLCALC", "LDLDIRECT"  Physical Exam:    VS:  BP 134/88   Pulse 87   Ht 5\' 7"  (1.702 m)   Wt 157 lb 12.8 oz (71.6 kg)   SpO2 96%   BMI 24.71 kg/m     Wt Readings from Last 3 Encounters:  01/02/24 157 lb 12.8 oz (71.6 kg)  12/26/21 161 lb (73 kg)  10/25/21 161 lb 4 oz (73.1 kg)     GEN:  Well nourished, well developed in no acute distress HEENT: Normal NECK: No JVD; No carotid bruits LYMPHATICS: No lymphadenopathy CARDIAC: RRR, no murmurs, no rubs, no gallops RESPIRATORY:  Clear to auscultation without rales, wheezing or rhonchi  ABDOMEN: Soft, non-tender, non-distended MUSCULOSKELETAL:  No edema; No deformity  SKIN: Warm and dry NEUROLOGIC:  Alert and oriented x 3 PSYCHIATRIC:  Normal affect   ASSESSMENT:    1. Dyslipidemia   2. Dyspnea on exertion   3. Coronary artery calcification    PLAN:    In order of problems listed above:  Dyslipidemia I did review K PN which show me her LDL of 133 HDL is high at 96 which is a good finding without knowing the fact that she does have already atherosclerosis I would like to intensify this therapy.  She has been taking 5 mg of Crestor 3 times a week her primary care physician increased to 10 mg every other day I recommend to go for Crestor 10 mg daily, we will schedule  her to have calcium score so we have some objective number of her calcium based on that we will be able to determine how aggressive we want to be with management of this problem obviously if calcium score will be high we will definitely try to get her LDL below 70.  In the meantime I simply recommended "10 mg of Crestor every single day, fasting lipid profile, AST LT 6 weeks. Coronary artery calcification plan as described above. Overall risk modification she is doing very well sticking with a good diet exercising on the regular basis which encouraged to continue   Medication Adjustments/Labs and Tests Ordered: Current medicines are reviewed at length with the patient today.  Concerns regarding medicines are outlined  above.  Orders Placed This Encounter  Procedures   EKG 12-Lead   No orders of the defined types were placed in this encounter.   Signed, Manfred Seed, MD, [facility]. 01/02/2024 2:04 PM    [facility]

## 2024-01-02 NOTE — Addendum Note (Signed)
 Addended by: Shawnee Dellen D on: 01/02/2024 02:27 PM   Modules accepted: Orders

## 2024-01-05 ENCOUNTER — Ambulatory Visit (HOSPITAL_BASED_OUTPATIENT_CLINIC_OR_DEPARTMENT_OTHER)
Admission: RE | Admit: 2024-01-05 | Discharge: 2024-01-05 | Disposition: A | Discharge status: Home / Self Care | Payer: Self-pay | Source: Ambulatory Visit | Attending: Cardiology | Admitting: Cardiology

## 2024-01-05 DIAGNOSIS — I2510 Atherosclerotic heart disease of native coronary artery without angina pectoris: Secondary | ICD-10-CM

## 2024-01-12 ENCOUNTER — Ambulatory Visit: Payer: Self-pay | Admitting: Cardiology

## 2024-02-01 IMAGING — CT CT ABD-PELV W/ CM
2 of 5 series · 17 of 46 positions shown, 19 images · IV contrast (Omnipaque)
Comparison: None.

CLINICAL DATA: Chronic intermittent abdominal pain. Irritable bowel
syndrome. Constipation. Diverticulosis. Positive family history of
colon carcinoma.

EXAM:
CT ABDOMEN AND PELVIS WITH CONTRAST
TECHNIQUE: Multidetector CT imaging of the abdomen and pelvis was performed
using the standard protocol following bolus administration of
intravenous contrast.

[Series 2: axial st · axial · 0.85mm/px · z∈[-438,-63]mm · 14 of 85 slices shown, 16 images]
[im 5/85  soft-tissue]
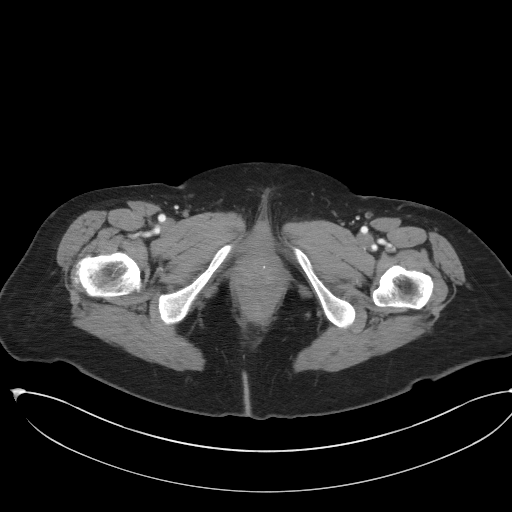
[im 5/85  bone]
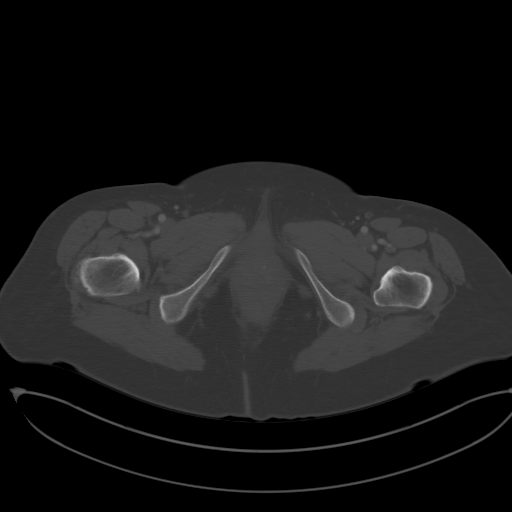
[im 10/85  soft-tissue]
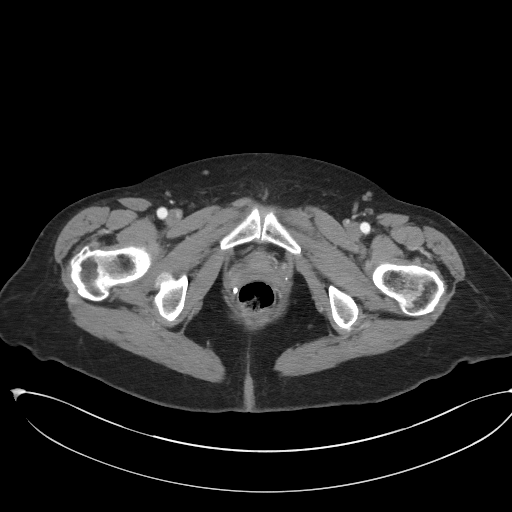
[im 19/85  soft-tissue]
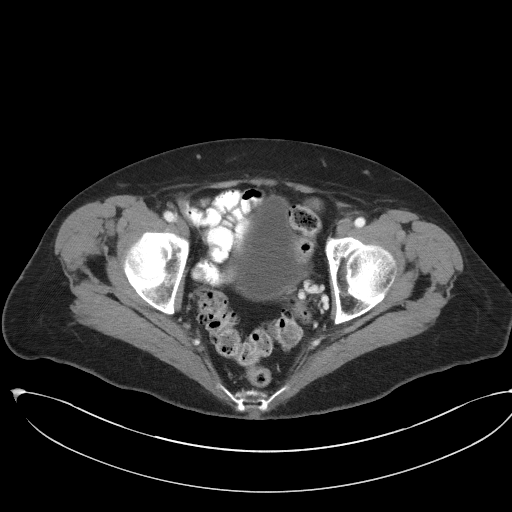
[im 24/85  soft-tissue]
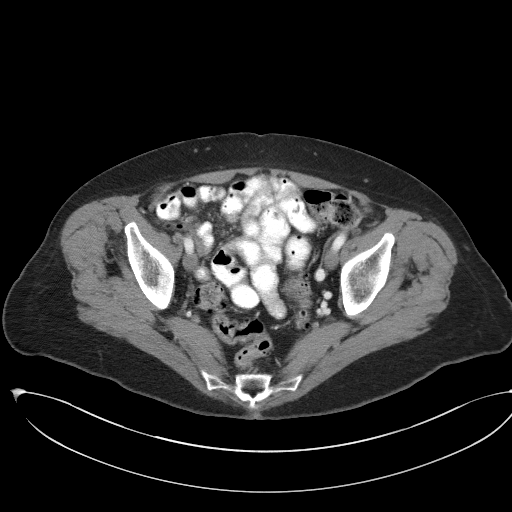
[im 29/85  soft-tissue]
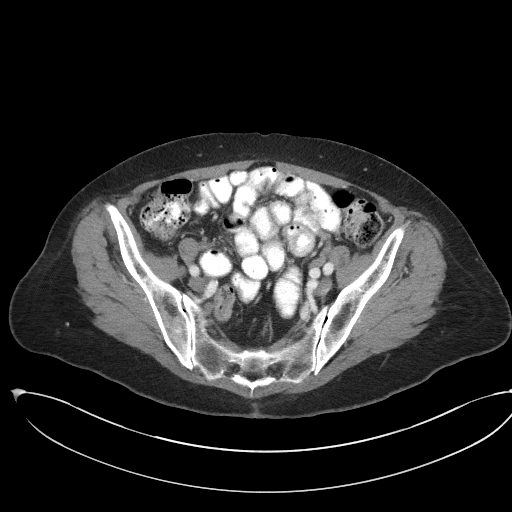
[im 33/85  soft-tissue]
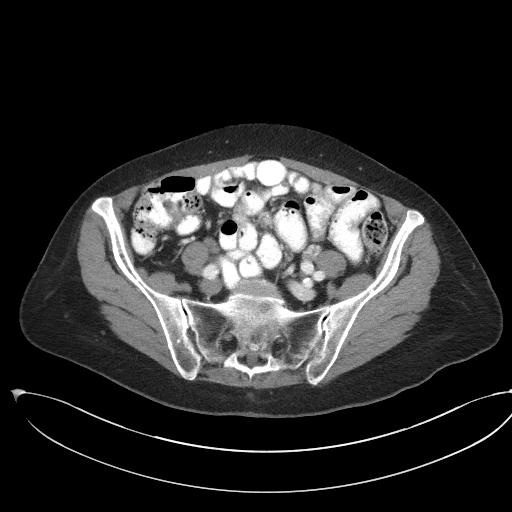
[im 38/85  soft-tissue]
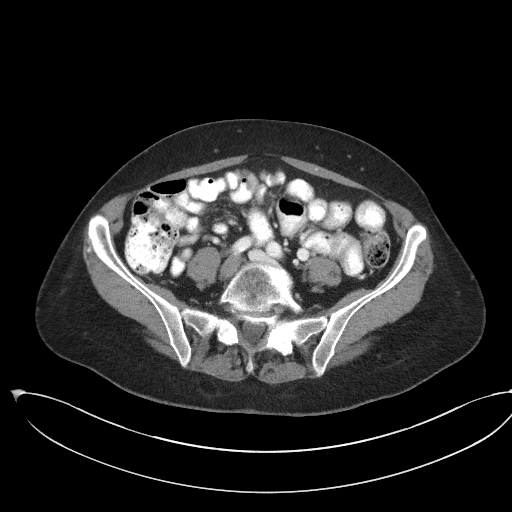
[im 47/85  soft-tissue]
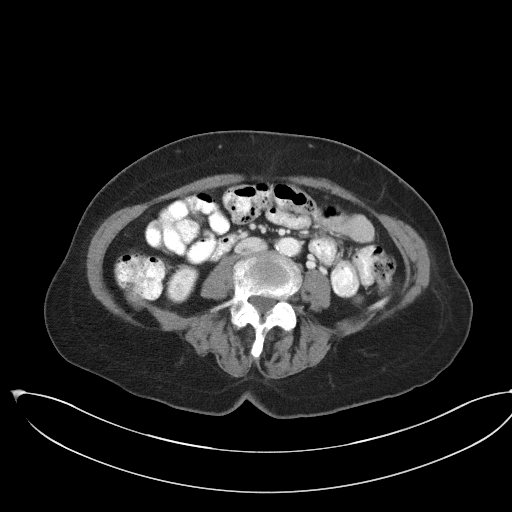
[im 52/85  soft-tissue]
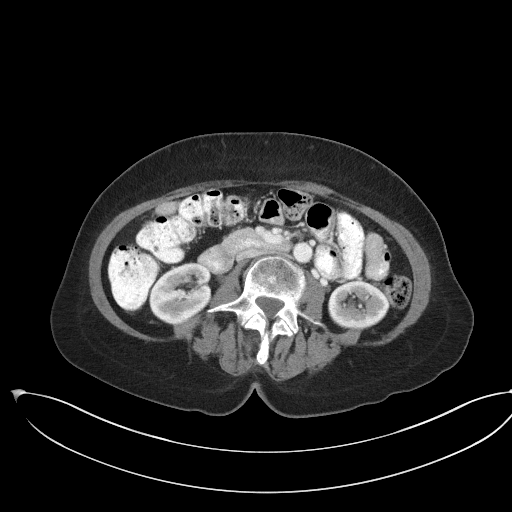
[im 52/85  bone]
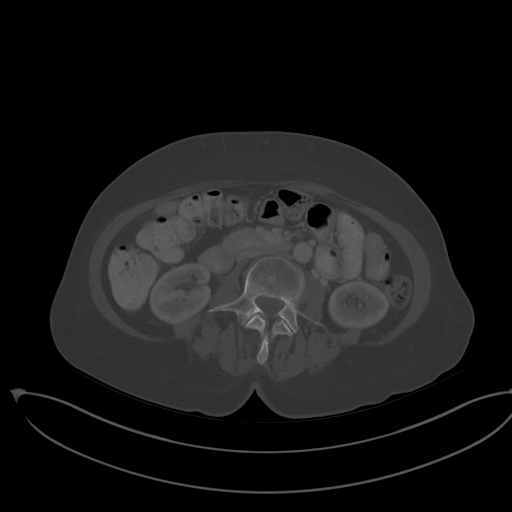
[im 57/85  soft-tissue]
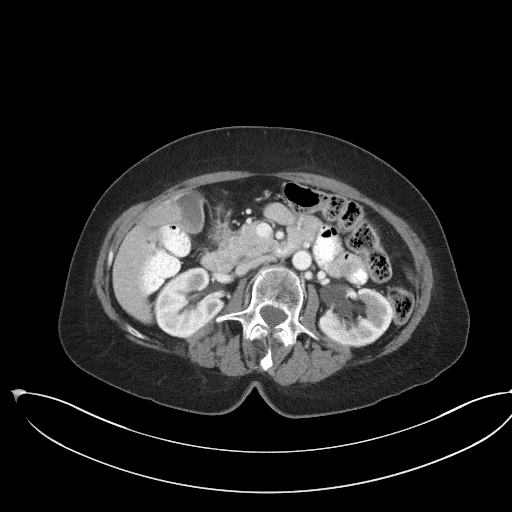
[im 61/85  soft-tissue]
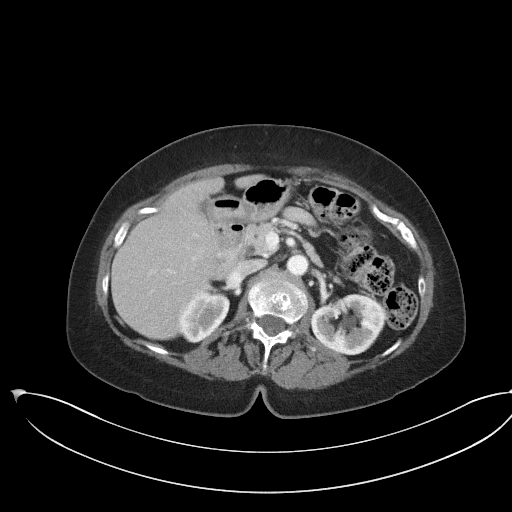
[im 66/85  soft-tissue]
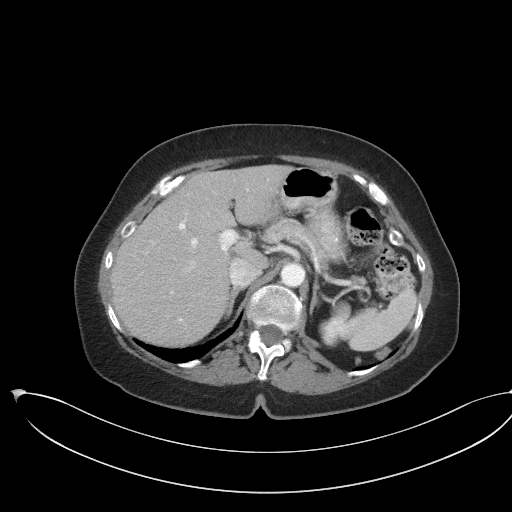
[im 75/85  soft-tissue]
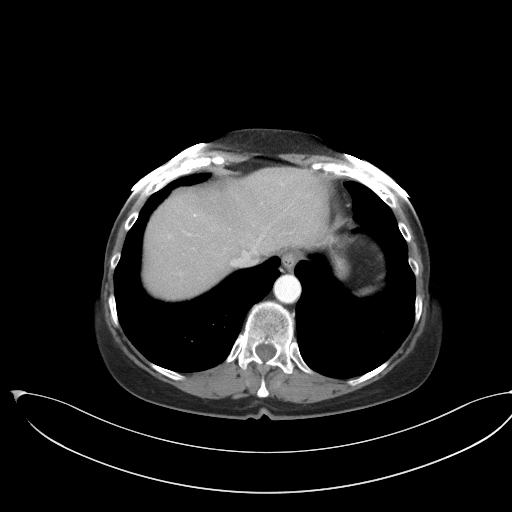
[im 80/85  soft-tissue]
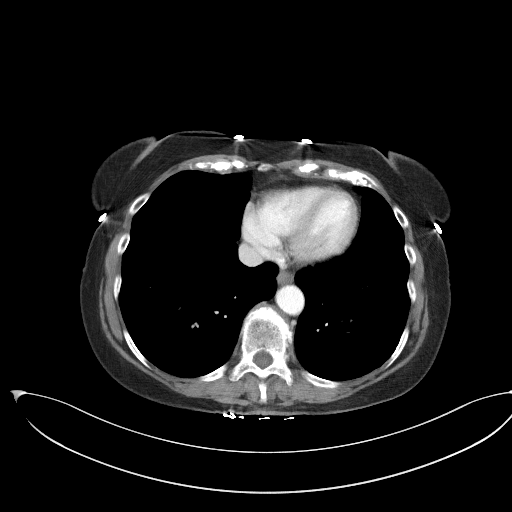

[Series 9: coronal st · coronal · 0.82mm/px · 3 of 87 slices shown]
[im 29/87  soft-tissue]
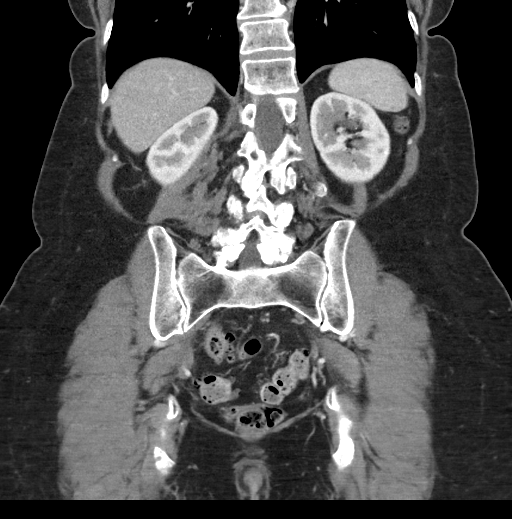
[im 39/87  soft-tissue]
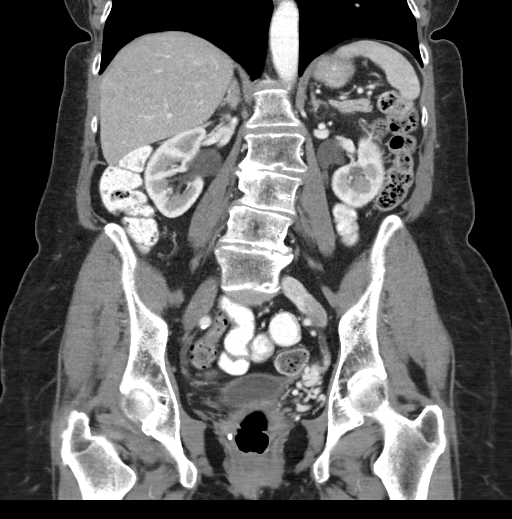
[im 48/87  soft-tissue]
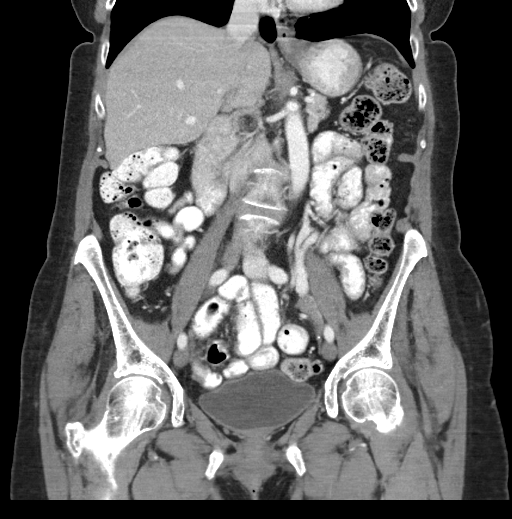

[17 of 46 positions shown; findings below may reference images not displayed]

RADIATION DOSE REDUCTION: This exam was performed according to the
departmental dose-optimization program which includes automated
exposure control, adjustment of the mA and/or kV according to
patient size and/or use of iterative reconstruction technique.

CONTRAST:  100mL OMNIPAQUE IOHEXOL 300 MG/ML  SOLN
FINDINGS: Lower Chest: No acute findings.

Hepatobiliary: No hepatic masses identified. Gallbladder is
unremarkable. No evidence of biliary ductal dilatation.

Pancreas:  No mass or inflammatory changes.

Spleen: Within normal limits in size and appearance.

Adrenals/Urinary Tract: No masses identified. No evidence of
ureteral calculi or hydronephrosis.

Stomach/Bowel: No evidence of obstruction, inflammatory process or
abnormal fluid collections. Normal appendix visualized.
Diverticulosis is seen mainly involving the sigmoid colon, however
there is no evidence of diverticulitis. A small umbilical hernia is
seen which contains only fat.

Vascular/Lymphatic: No pathologically enlarged lymph nodes. No acute
vascular findings. Aortic atherosclerotic calcification noted.

Reproductive: Prior hysterectomy noted. Adnexal regions are
unremarkable in appearance.

Other:  None.

Musculoskeletal:  No suspicious bone lesions identified.
IMPRESSION: Colonic diverticulosis, without radiographic evidence of
diverticulitis or other acute findings.

Small umbilical hernia which contains only fat.

Aortic Atherosclerosis ([id number]).

## 2024-02-04 ENCOUNTER — Ambulatory Visit: Attending: Cardiology

## 2024-02-04 DIAGNOSIS — R0609 Other forms of dyspnea: Secondary | ICD-10-CM | POA: Diagnosis not present

## 2024-02-05 LAB — ECHOCARDIOGRAM COMPLETE
AR max vel: 2.03 cm2
AV Area VTI: 2.23 cm2
AV Area mean vel: 2.36 cm2
AV Mean grad: 3.8 mmHg
AV Peak grad: 8.9 mmHg
Ao pk vel: 1.49 m/s
Area-P 1/2: 3.74 cm2
MV M vel: 4.36 m/s
MV Peak grad: 76 mmHg
P 1/2 time: 348 ms
S' Lateral: 2.6 cm

## 2024-02-18 ENCOUNTER — Telehealth: Payer: Self-pay

## 2024-02-18 NOTE — Telephone Encounter (Signed)
 Pt viewed Echo results on My Chart per Dr. Vanetta Shawl note. Routed to PCP.

## 2024-02-19 DIAGNOSIS — E785 Hyperlipidemia, unspecified: Secondary | ICD-10-CM | POA: Diagnosis not present

## 2024-02-20 LAB — LIPID PANEL
Chol/HDL Ratio: 2.6 ratio (ref 0.0–4.4)
Cholesterol, Total: 224 mg/dL — ABNORMAL HIGH (ref 100–199)
HDL: 87 mg/dL (ref >39)
LDL Chol Calc (NIH): 122 mg/dL — ABNORMAL HIGH (ref 0–99)
Triglycerides: 84 mg/dL (ref 0–149)
VLDL Cholesterol Cal: 15 mg/dL (ref 5–40)

## 2024-02-20 LAB — ALT: ALT: 19 IU/L (ref 0–32)

## 2024-02-20 LAB — AST: AST: 26 IU/L (ref 0–40)

## 2024-03-09 ENCOUNTER — Telehealth: Payer: Self-pay

## 2024-03-09 NOTE — Telephone Encounter (Signed)
 Pt viewed lab results on My Chart per Dr. Vanetta Shawl note. Routed to PCP.

## 2024-03-16 DIAGNOSIS — M25561 Pain in right knee: Secondary | ICD-10-CM | POA: Diagnosis not present

## 2024-03-16 DIAGNOSIS — G8929 Other chronic pain: Secondary | ICD-10-CM | POA: Diagnosis not present

## 2024-03-16 DIAGNOSIS — M7061 Trochanteric bursitis, right hip: Secondary | ICD-10-CM | POA: Diagnosis not present

## 2024-04-02 ENCOUNTER — Other Ambulatory Visit: Payer: Self-pay

## 2024-04-06 ENCOUNTER — Ambulatory Visit: Attending: Cardiology | Admitting: Cardiology

## 2024-04-06 ENCOUNTER — Encounter: Payer: Self-pay | Admitting: Cardiology

## 2024-04-06 VITALS — BP 110/60 | HR 80 | Ht 67.0 in | Wt 155.2 lb

## 2024-04-06 DIAGNOSIS — R0609 Other forms of dyspnea: Secondary | ICD-10-CM | POA: Diagnosis not present

## 2024-04-06 DIAGNOSIS — I2510 Atherosclerotic heart disease of native coronary artery without angina pectoris: Secondary | ICD-10-CM | POA: Diagnosis not present

## 2024-04-06 DIAGNOSIS — E785 Hyperlipidemia, unspecified: Secondary | ICD-10-CM | POA: Diagnosis not present

## 2024-04-06 DIAGNOSIS — K219 Gastro-esophageal reflux disease without esophagitis: Secondary | ICD-10-CM | POA: Diagnosis not present

## 2024-04-06 MED ORDER — PRAVASTATIN SODIUM 20 MG PO TABS
20.0000 mg | ORAL_TABLET | Freq: Every evening | ORAL | 3 refills | Status: AC
Start: 2024-04-06 — End: 2024-07-05

## 2024-04-06 NOTE — Patient Instructions (Addendum)
 Medication Instructions:   STOP: Crestor   START: Pravastatin  20mg  1 tablet daily   Lab Work: Your physician recommends that you return for lab work in: 6 weeks You need to have labs done when you are fasting.  You can come Monday through Friday 8:30 am to 12:00 pm and 1:15 to 4:30. You do not need to make an appointment as the order has already been placed. The labs you are going to have done are AST, ALT, Lipids.    Testing/Procedures: None Ordered   Follow-Up: At [facility], you and your health needs are our priority.  As part of our continuing mission to provide you with exceptional heart care, we have created designated Provider Care Teams.  These Care Teams include your primary Cardiologist (physician) and Advanced Practice Providers (APPs -  Physician Assistants and Nurse Practitioners) who all work together to provide you with the care you need, when you need it.  We recommend signing up for the patient portal called MyChart.  Sign up information is provided on this After Visit Summary.  MyChart is used to connect with patients for Virtual Visits (Telemedicine).  Patients are able to view lab/test results, encounter notes, upcoming appointments, etc.  Non-urgent messages can be sent to your provider as well.   To learn more about what you can do with MyChart, go to [email or web address].    Your next appointment:   6 month(s)  The format for your next appointment:   In Person  Provider:   Lamar Fitch, MD    Other Instructions NA

## 2024-04-06 NOTE — Progress Notes (Unsigned)
 Cardiology Office Note:    Date:  04/06/2024   ID:  Gina Hansen, DOB 10/10/1945, MRN [id number]  PCP:  Erick Greig LABOR, NP  Cardiologist:  Lamar Fitch, MD    Referring MD: Keren Vicenta BRAVO, MD   No chief complaint on file. [name]  History of Present Illness:    Gina Hansen is a 78 y.o. femalePast medical history significant for dyslipidemia, GERD, she did have CT of the chest which showed calcification of LAD.  She was sent to me for evaluation.  She is very active she exercise on the regular basis goes to [facility] does yard work with no difficulties no chest pain tightness squeezing pressure burning chest really she is asymptomatic calcium  score is elevated at 153 which is 67 percentile and I brought her today to discuss that issue.   Past Medical History:  Diagnosis Date   Allergic rhinitis    Chronic constipation    Chronic cough 02/23/2016   Coronary artery calcification 01/02/2024   Cough    Dyslipidemia    Dyspnea on exertion 01/02/2024   GERD (gastroesophageal reflux disease)    Pulmonary infiltrate present on computed tomography 02/23/2016    Past Surgical History:  Procedure Laterality Date   COLONOSCOPY  08/06/2003   COLONOSCOPY  04/10/2017   Mild sigmoid diverticulosis. Otherwsise normal colonoscopy. The colon was highly redundant   ESOPHAGOGASTRODUODENOSCOPY  11/01/2015   Small hiatal hernia. Irregular z-line suggestive gastroesophageal reflux. No active erosion (biopsied) Mild gastritis.   KNEE ARTHROSCOPY Left 08/05/2012   PARTIAL HYSTERECTOMY Left 08/06/1983   UPPER GASTROINTESTINAL ENDOSCOPY      Current Medications: Current Meds  Medication Sig   cetirizine (ZYRTEC) 10 MG tablet Take 10 mg by mouth daily.   Cholecalciferol (VITAMIN D) 50 MCG (2000 UT) CAPS Take 2,000 Units by mouth daily.   Coenzyme Q10 (COQ10) 200 MG CAPS Take 1 capsule by mouth daily.   Magnesium  Glycinate 100 MG CAPS Take 200 mg by mouth daily.   rosuvastatin  (CRESTOR ) 10 MG  tablet Take 1 tablet (10 mg total) by mouth daily.   Current Facility-Administered Medications for the 04/06/24 encounter (Office Visit) with Niamh Rada J, MD  Medication   polyethylene glycol (MIRALAX  / GLYCOLAX ) packet 17 g     Allergies:   Levaquin [levofloxacin in d5w]   Social History   Socioeconomic History   Marital status: Married    Spouse name: Not on file   Number of children: 3   Years of education: Not on file   Highest education level: Not on file  Occupational History   Occupation: Part Time - Hair Dresser  Tobacco Use   Smoking status: Never   Smokeless tobacco: Not on file  Vaping Use   Vaping status: Never Used  Substance and Sexual Activity   Alcohol use: Yes    Alcohol/week: 0.0 standard drinks of alcohol    Comment: occasional   Drug use: No   Sexual activity: Not on file  Other Topics Concern   Not on file  Social History Narrative   Not on file   Social Drivers of Health   Financial Resource Strain: Not on file  Food Insecurity: Not on file  Transportation Needs: Not on file  Physical Activity: Not on file  Stress: Not on file  Social Connections: Not on file     Family History: The patient's family history includes Anemia in an other family member; Asthma in her father; Colon cancer in her child; Heart disease  in her father; Hypercholesterolemia in an other family member; Hypertension in her mother; Kidney disease in her father. ROS:   Please see the history of present illness.    All 14 point review of systems negative except as described per history of present illness  EKGs/Labs/Other Studies Reviewed:         Recent Labs: 02/19/2024: ALT 19  Recent Lipid Panel    Component Value Date/Time   CHOL 224 (H) 02/19/2024 0901   TRIG 84 02/19/2024 0901   HDL 87 02/19/2024 0901   CHOLHDL 2.6 02/19/2024 0901   LDLCALC 122 (H) 02/19/2024 0901    Physical Exam:    VS:  BP 110/60   Pulse 80   Ht 5' 7 (1.702 m)   Wt 155 lb 3.2  oz (70.4 kg)   SpO2 97%   BMI 24.31 kg/m     Wt Readings from Last 3 Encounters:  04/06/24 155 lb 3.2 oz (70.4 kg)  01/02/24 157 lb 12.8 oz (71.6 kg)  12/26/21 161 lb (73 kg)     GEN:  Well nourished, well developed in no acute distress HEENT: Normal NECK: No JVD; No carotid bruits LYMPHATICS: No lymphadenopathy CARDIAC: RRR, no murmurs, no rubs, no gallops RESPIRATORY:  Clear to auscultation without rales, wheezing or rhonchi  ABDOMEN: Soft, non-tender, non-distended MUSCULOSKELETAL:  No edema; No deformity  SKIN: Warm and dry LOWER EXTREMITIES: no swelling NEUROLOGIC:  Alert and oriented x 3 PSYCHIATRIC:  Normal affect   ASSESSMENT:    1. Coronary artery calcification calcium  score 153 which is 67 percentile   2. Gastroesophageal reflux disease, unspecified whether esophagitis present   3. Dyslipidemia   4. Dyspnea on exertion    PLAN:    In order of problems listed above:  Coronary calcium  score we spent a great of time talking about this.  She takes rosuvastatin  only once a week, she think is give her bursitis which I told her in the surrounding unlikely but we decided to switch her to a different medication she tried Lipitor already.  I will put her on pravastatin  20 mg daily.  Fasting lipid profile, AST ALT will happen in 6 weeks.  I encouraged her to keep exercising. Gastroesophageal reflux disease that is stable. Dyspnea on exertion echocardiogram showed preserved left ventricular ejection fraction she Exercising. Dyslipidemia discussion as above   Medication Adjustments/Labs and Tests Ordered: Current medicines are reviewed at length with the patient today.  Concerns regarding medicines are outlined above.  No orders of the defined types were placed in this encounter.  Medication changes: No orders of the defined types were placed in this encounter.   Signed, Lamar DOROTHA Fitch, MD, [facility] 04/06/2024 9:00 AM    [facility]

## 2024-04-15 DIAGNOSIS — R0981 Nasal congestion: Secondary | ICD-10-CM | POA: Diagnosis not present

## 2024-06-14 DIAGNOSIS — N6311 Unspecified lump in the right breast, upper outer quadrant: Secondary | ICD-10-CM | POA: Diagnosis not present

## 2024-06-14 DIAGNOSIS — I2510 Atherosclerotic heart disease of native coronary artery without angina pectoris: Secondary | ICD-10-CM | POA: Diagnosis not present

## 2024-06-14 DIAGNOSIS — R030 Elevated blood-pressure reading, without diagnosis of hypertension: Secondary | ICD-10-CM | POA: Diagnosis not present

## 2024-06-14 DIAGNOSIS — Z23 Encounter for immunization: Secondary | ICD-10-CM | POA: Diagnosis not present

## 2024-06-14 DIAGNOSIS — J4520 Mild intermittent asthma, uncomplicated: Secondary | ICD-10-CM | POA: Diagnosis not present

## 2024-06-14 DIAGNOSIS — E785 Hyperlipidemia, unspecified: Secondary | ICD-10-CM | POA: Diagnosis not present

## 2024-06-14 DIAGNOSIS — M810 Age-related osteoporosis without current pathological fracture: Secondary | ICD-10-CM | POA: Diagnosis not present

## 2024-06-14 DIAGNOSIS — Z6824 Body mass index (BMI) 24.0-24.9, adult: Secondary | ICD-10-CM | POA: Diagnosis not present

## 2024-06-14 DIAGNOSIS — I700 Atherosclerosis of aorta: Secondary | ICD-10-CM | POA: Diagnosis not present

## 2024-06-16 DIAGNOSIS — R92331 Mammographic heterogeneous density, right breast: Secondary | ICD-10-CM | POA: Diagnosis not present

## 2024-06-16 DIAGNOSIS — N6311 Unspecified lump in the right breast, upper outer quadrant: Secondary | ICD-10-CM | POA: Diagnosis not present
